# Patient Record
Sex: Female | Born: 1955 | Race: Black or African American | Hispanic: No | Marital: Married | Smoking: Never smoker
Health system: Southern US, Community
[De-identification: ages and names within clinical notes are randomized; demographics above are authoritative.]

## PROBLEM LIST (undated history)

## (undated) DIAGNOSIS — T8859XA Other complications of anesthesia, initial encounter: Secondary | ICD-10-CM

## (undated) DIAGNOSIS — L930 Discoid lupus erythematosus: Secondary | ICD-10-CM

## (undated) DIAGNOSIS — E785 Hyperlipidemia, unspecified: Secondary | ICD-10-CM

## (undated) DIAGNOSIS — R7303 Prediabetes: Secondary | ICD-10-CM

## (undated) DIAGNOSIS — I10 Essential (primary) hypertension: Secondary | ICD-10-CM

## (undated) DIAGNOSIS — E7800 Pure hypercholesterolemia, unspecified: Secondary | ICD-10-CM

## (undated) DIAGNOSIS — F32A Depression, unspecified: Secondary | ICD-10-CM

## (undated) DIAGNOSIS — Z972 Presence of dental prosthetic device (complete) (partial): Secondary | ICD-10-CM

## (undated) DIAGNOSIS — G5601 Carpal tunnel syndrome, right upper limb: Secondary | ICD-10-CM

## (undated) DIAGNOSIS — F419 Anxiety disorder, unspecified: Secondary | ICD-10-CM

## (undated) DIAGNOSIS — J45909 Unspecified asthma, uncomplicated: Secondary | ICD-10-CM

## (undated) DIAGNOSIS — Z973 Presence of spectacles and contact lenses: Secondary | ICD-10-CM

## (undated) DIAGNOSIS — M179 Osteoarthritis of knee, unspecified: Secondary | ICD-10-CM

## (undated) DIAGNOSIS — D509 Iron deficiency anemia, unspecified: Secondary | ICD-10-CM

## (undated) DIAGNOSIS — M1990 Unspecified osteoarthritis, unspecified site: Secondary | ICD-10-CM

## (undated) DIAGNOSIS — C801 Malignant (primary) neoplasm, unspecified: Secondary | ICD-10-CM

## (undated) HISTORY — PX: FOOT SURGERY: SHX648

## (undated) HISTORY — PX: ABDOMINAL HYSTERECTOMY: SHX81

---

## 1984-07-30 HISTORY — PX: FOOT SURGERY: SHX648

## 2000-07-30 DIAGNOSIS — Z9221 Personal history of antineoplastic chemotherapy: Secondary | ICD-10-CM

## 2000-07-30 DIAGNOSIS — Z8543 Personal history of malignant neoplasm of ovary: Secondary | ICD-10-CM

## 2000-07-30 HISTORY — DX: Personal history of malignant neoplasm of ovary: Z85.43

## 2000-07-30 HISTORY — DX: Personal history of antineoplastic chemotherapy: Z92.21

## 2000-07-30 HISTORY — PX: TOTAL ABDOMINAL HYSTERECTOMY W/ BILATERAL SALPINGOOPHORECTOMY: SHX83

## 2004-03-31 ENCOUNTER — Ambulatory Visit: Payer: Self-pay | Admitting: Internal Medicine

## 2004-10-28 ENCOUNTER — Emergency Department (HOSPITAL_COMMUNITY): Admission: EM | Admit: 2004-10-28 | Discharge: 2004-10-28 | Payer: Self-pay | Admitting: Emergency Medicine

## 2005-08-19 ENCOUNTER — Emergency Department (HOSPITAL_COMMUNITY): Admission: EM | Admit: 2005-08-19 | Discharge: 2005-08-19 | Payer: Self-pay | Admitting: Emergency Medicine

## 2006-05-09 ENCOUNTER — Encounter: Admission: RE | Admit: 2006-05-09 | Discharge: 2006-07-10 | Payer: Self-pay | Admitting: Orthopaedic Surgery

## 2006-06-05 ENCOUNTER — Ambulatory Visit (HOSPITAL_COMMUNITY): Admission: RE | Admit: 2006-06-05 | Discharge: 2006-06-05 | Payer: Self-pay | Admitting: Orthopaedic Surgery

## 2006-08-18 ENCOUNTER — Emergency Department (HOSPITAL_COMMUNITY): Admission: EM | Admit: 2006-08-18 | Discharge: 2006-08-18 | Payer: Self-pay | Admitting: Family Medicine

## 2007-09-02 ENCOUNTER — Emergency Department (HOSPITAL_COMMUNITY): Admission: EM | Admit: 2007-09-02 | Discharge: 2007-09-02 | Payer: Self-pay | Admitting: Emergency Medicine

## 2007-10-07 ENCOUNTER — Encounter: Admission: RE | Admit: 2007-10-07 | Discharge: 2007-12-05 | Payer: Self-pay | Admitting: Orthopaedic Surgery

## 2007-12-26 ENCOUNTER — Ambulatory Visit (HOSPITAL_COMMUNITY): Admission: RE | Admit: 2007-12-26 | Discharge: 2007-12-26 | Payer: Self-pay | Admitting: Gastroenterology

## 2007-12-26 ENCOUNTER — Encounter (INDEPENDENT_AMBULATORY_CARE_PROVIDER_SITE_OTHER): Payer: Self-pay | Admitting: Gastroenterology

## 2008-01-06 ENCOUNTER — Ambulatory Visit (HOSPITAL_COMMUNITY): Admission: RE | Admit: 2008-01-06 | Discharge: 2008-01-06 | Payer: Self-pay | Admitting: Orthopaedic Surgery

## 2008-07-21 ENCOUNTER — Ambulatory Visit (HOSPITAL_COMMUNITY): Admission: RE | Admit: 2008-07-21 | Discharge: 2008-07-21 | Payer: Self-pay | Admitting: Interventional Radiology

## 2008-07-30 DIAGNOSIS — Z86718 Personal history of other venous thrombosis and embolism: Secondary | ICD-10-CM

## 2008-07-30 HISTORY — DX: Personal history of other venous thrombosis and embolism: Z86.718

## 2008-08-14 ENCOUNTER — Emergency Department (HOSPITAL_COMMUNITY): Admission: EM | Admit: 2008-08-14 | Discharge: 2008-08-14 | Payer: Self-pay | Admitting: Family Medicine

## 2008-08-16 ENCOUNTER — Ambulatory Visit (HOSPITAL_COMMUNITY): Admission: RE | Admit: 2008-08-16 | Discharge: 2008-08-16 | Payer: Self-pay | Admitting: Gastroenterology

## 2009-02-24 ENCOUNTER — Ambulatory Visit (HOSPITAL_COMMUNITY): Admission: RE | Admit: 2009-02-24 | Discharge: 2009-02-24 | Payer: Self-pay | Admitting: Internal Medicine

## 2009-06-13 ENCOUNTER — Encounter: Admission: RE | Admit: 2009-06-13 | Discharge: 2009-06-13 | Payer: Self-pay | Admitting: Internal Medicine

## 2010-08-21 ENCOUNTER — Encounter: Payer: Self-pay | Admitting: Internal Medicine

## 2010-12-12 NOTE — Op Note (Signed)
[name], Kristen Villarreal             ACCOUNT NO.:  [id number]   MEDICAL RECORD NO.:  [id number]          PATIENT TYPE:  AMB   LOCATION:  ENDO                         FACILITY:  [facility]   PHYSICIAN:  Anselmo Rod, M.D.  DATE OF BIRTH:  [birth year]   DATE OF PROCEDURE:  12/26/2007  DATE OF DISCHARGE:  12/26/2007                               OPERATIVE REPORT   PROCEDURE PERFORMED:  Screening colonoscopy.   ENDOSCOPIST:  Anselmo Rod, M.D.   INSTRUMENT USED:  Pentax video colonoscope.   INDICATIONS FOR PROCEDURE:  55 year old African-American female  undergoing a screening colonoscopy. The patient has a history of iron  deficiency anemia, rule out chronic polyps, masses, etc.   PREPROCEDURE PREPARATION:  Informed consent was secured from the  patient.  The patient fasted for 8 hours prior to the procedure and  prepped with a bottle of magnesium citrate and a gallon of NuLYTELY the  night prior to the procedure.  The risks and benefits of the procedure  including a 10% missed rate of cancer and polyp were discussed with the  patient as well.   PREPROCEDURE PHYSICAL:  The patient has stable vital signs.  NECK:  Supple.  CHEST:  Clear to auscultation.  Spine is erect.  ABDOMEN:  Soft with normal bowel sounds.   DESCRIPTION OF THE PROCEDURE:  The patient was placed in the left  lateral decubitus position, sedated with 15 mcg of Fentanyl and 4 mg of  Versed given intravenously in slow incremental doses. Once the patient  was adequately sedated and maintained on low flow oxygen and continuous  cardiac monitoring, the Pentax video colonoscope was advanced into the  rectum to the cecum.  The appendiceal orifice and the ileocecal valve  were clearly visualized and photographed.  No masses, polyps, erosions,  ulcerations, or diverticula were noted.  Retroflexion in the rectum  revealed no abnormalities.  The patient tolerated the procedure well  without immediate complications.   IMPRESSION:  Normal colonoscopy up to the cecum.  No masses, polyps,  erosions, ulcerations, or diverticula were noted.   RECOMMENDATIONS:  1. Continue high-fiber diet with liver fluid intake.  2. Repeat colonoscopy in the next five years unless the patient has      any abnormal symptoms intermittent in which case she should contact      the office immediately for further recommendations.  3. Outpatient follow up  in the next two weeks or earlier if need be.      Anselmo Rod, M.D.  Electronically Signed     JNM/MEDQ  D:  01/02/2008  T:  01/02/2008  Job:  [id number]   cc:   Kathryne Hitch, MD  Fax: [phone]

## 2010-12-12 NOTE — Op Note (Signed)
[name], Kristen Villarreal             ACCOUNT NO.:  [id number]   MEDICAL RECORD NO.:  [id number]          PATIENT TYPE:  AMB   LOCATION:  ENDO                         FACILITY:  [facility]   PHYSICIAN:  Anselmo Rod, M.D.  DATE OF BIRTH:  08/11/1955   DATE OF PROCEDURE:  12/26/2007  DATE OF DISCHARGE:  12/26/2007                               OPERATIVE REPORT   PROCEDURE PERFORMED:  Esophagogastroduodenoscopy with small-bowel  biopsies.   ENDOSCOPIST:  Anselmo Rod, MD   INSTRUMENT USED:  Pentax video panendoscope.   INDICATIONS FOR PROCEDURE:  A 55 year old African American female  undergoing an EGD for iron-deficiency anemia, rule out sprue.   PREPROCEDURE PREPARATION:  Informed consent was procured from the  patient.  The patient had fasted for 8 hours prior to the procedure.  Risks and benefits of the procedure including a bleeding, perforation,  etc., were discussed with the patient in detail.   PREPROCEDURE PHYSICAL:  The patient had stable vital signs.  NECK:  Supple.  CHEST:  Clear to auscultation.  S1, S2 regular.  ABDOMEN:  Soft with normal bowel sounds.   DESCRIPTION OF PROCEDURE:  The patient was placed in the left lateral  decubitus position and sedated with 10 mcg of Fentanyl and 6 mg of  Versed given intravenously in slow incremental doses as she had a drop  in her blood pressure slightly prior to sedation.  Once the patient was  adequately sedate and maintained on low-flow oxygen and continuous  cardiac monitoring, the Pentax video panendoscope was advanced through  the mouthpiece, over the tongue, into the esophagus under direct vision.  The vocal cords appeared healthy.  The entire esophagus was widely  patent with no evidence of ring, stricture, mass, esophagitis or  Barrett's mucosa.  The scope was then advanced into the stomach.  The  entire gastric mucosa appeared healthy.  The proximal small bowel was  normal.  Small-bowel biopsies were done to rule out  sprue.  A small  hiatal hernia was seen on high retroflexion.  The patient tolerated the  procedure well without immediate complications.   IMPRESSION:  1. Healthy appearing vocal cords.  2. Normal-appearing, widely patent esophagus.  3. Healthy gastroesophageal junction/ Z-line.  4. Small hiatal hernia seen on high retroflexion.  5. Normal proximal small bowel.  Small-bowel biopsies done to rule out      sprue.   RECOMMENDATIONS:  1. Await pathology results.  2. Avoid all nonsteroidals for now.  3. Proceed with a colonoscopy at this time.  4. Further recommendations to be made thereafter.      Anselmo Rod, M.D.  Electronically Signed     JNM/MEDQ  D:  01/02/2008  T:  01/02/2008  Job:  [id number]   cc:   Pollyann Savoy, M.D.  Fax: [phone]

## 2012-06-18 ENCOUNTER — Other Ambulatory Visit: Payer: Self-pay | Admitting: Internal Medicine

## 2012-06-18 DIAGNOSIS — Z1231 Encounter for screening mammogram for malignant neoplasm of breast: Secondary | ICD-10-CM

## 2012-07-29 ENCOUNTER — Ambulatory Visit
Admission: RE | Admit: 2012-07-29 | Discharge: 2012-07-29 | Disposition: A | Discharge status: Home / Self Care | Payer: Medicare Other | Source: Ambulatory Visit | Attending: Internal Medicine | Admitting: Internal Medicine

## 2012-07-29 DIAGNOSIS — Z1231 Encounter for screening mammogram for malignant neoplasm of breast: Secondary | ICD-10-CM

## 2013-04-08 ENCOUNTER — Institutional Professional Consult (permissible substitution): Payer: Medicare Other | Admitting: Internal Medicine

## 2013-04-17 ENCOUNTER — Institutional Professional Consult (permissible substitution): Payer: Self-pay | Admitting: Internal Medicine

## 2013-06-30 ENCOUNTER — Other Ambulatory Visit: Payer: Self-pay

## 2013-06-30 DIAGNOSIS — Z1231 Encounter for screening mammogram for malignant neoplasm of breast: Secondary | ICD-10-CM

## 2013-08-07 ENCOUNTER — Ambulatory Visit
Admission: RE | Admit: 2013-08-07 | Discharge: 2013-08-07 | Disposition: A | Discharge status: Home / Self Care | Payer: Medicare Other | Source: Ambulatory Visit

## 2013-08-07 DIAGNOSIS — Z1231 Encounter for screening mammogram for malignant neoplasm of breast: Secondary | ICD-10-CM

## 2013-10-09 ENCOUNTER — Encounter (HOSPITAL_COMMUNITY): Payer: Self-pay | Admitting: Emergency Medicine

## 2013-10-09 ENCOUNTER — Emergency Department (HOSPITAL_COMMUNITY)
Admission: EM | Admit: 2013-10-09 | Discharge: 2013-10-09 | Disposition: A | Discharge status: Home / Self Care | Payer: Medicare Other | Attending: Emergency Medicine | Admitting: Emergency Medicine

## 2013-10-09 ENCOUNTER — Emergency Department (HOSPITAL_COMMUNITY): Payer: Medicare Other

## 2013-10-09 DIAGNOSIS — Y929 Unspecified place or not applicable: Secondary | ICD-10-CM | POA: Insufficient documentation

## 2013-10-09 DIAGNOSIS — Z23 Encounter for immunization: Secondary | ICD-10-CM | POA: Insufficient documentation

## 2013-10-09 DIAGNOSIS — T148XXA Other injury of unspecified body region, initial encounter: Secondary | ICD-10-CM

## 2013-10-09 DIAGNOSIS — W268XXA Contact with other sharp object(s), not elsewhere classified, initial encounter: Secondary | ICD-10-CM | POA: Insufficient documentation

## 2013-10-09 DIAGNOSIS — Y9389 Activity, other specified: Secondary | ICD-10-CM | POA: Insufficient documentation

## 2013-10-09 DIAGNOSIS — S91309A Unspecified open wound, unspecified foot, initial encounter: Secondary | ICD-10-CM | POA: Insufficient documentation

## 2013-10-09 DIAGNOSIS — J45909 Unspecified asthma, uncomplicated: Secondary | ICD-10-CM | POA: Insufficient documentation

## 2013-10-09 DIAGNOSIS — I10 Essential (primary) hypertension: Secondary | ICD-10-CM | POA: Insufficient documentation

## 2013-10-09 HISTORY — DX: Essential (primary) hypertension: I10

## 2013-10-09 HISTORY — DX: Unspecified asthma, uncomplicated: J45.909

## 2013-10-09 MED ORDER — CIPROFLOXACIN HCL 500 MG PO TABS: 500.0000 mg | ORAL_TABLET | Freq: Two times a day (BID) | ORAL | Status: DC

## 2013-10-09 MED ORDER — HYDROCODONE-ACETAMINOPHEN 5-325 MG PO TABS: 1.0000 | ORAL_TABLET | Freq: Four times a day (QID) | ORAL | Status: DC | PRN

## 2013-10-09 MED ORDER — TETANUS-DIPHTH-ACELL PERTUSSIS 5-2.5-18.5 LF-MCG/0.5 IM SUSP
0.5000 mL | Freq: Once | INTRAMUSCULAR | Status: AC
  Administered 2013-10-09: 0.5 mL via INTRAMUSCULAR
  Filled 2013-10-09: qty 0.5

## 2013-10-09 NOTE — ED Notes (Signed)
Patient transported to X-ray 

## 2013-10-09 NOTE — ED Provider Notes (Signed)
Medical screening examination/treatment/procedure(s) were performed by non-physician practitioner and as supervising physician I was immediately available for consultation/collaboration.   EKG Interpretation None        Osvaldo Shipper, MD 10/09/13 [phone]

## 2013-10-09 NOTE — ED Notes (Signed)
Puncture wound to the rt foot.  She stepped on a nail rusty just pta .Marland Kitchen  The nail went through the shoe on that foot

## 2013-10-09 NOTE — Discharge Instructions (Signed)
Puncture Wound °A puncture wound is an injury that extends through all layers of the skin and into the tissue beneath the skin (subcutaneous tissue). Puncture wounds become infected easily because germs often enter the body and go beneath the skin during the injury. Having a deep wound with a small entrance point makes it difficult for your caregiver to adequately clean the wound. This is especially true if you have stepped on a nail and it has passed through a dirty shoe or other situations where the wound is obviously contaminated. °CAUSES  °Many puncture wounds involve glass, nails, splinters, fish hooks, or other objects that enter the skin (foreign bodies). A puncture wound may also be caused by a human bite or animal bite. °DIAGNOSIS  °A puncture wound is usually diagnosed by your history and a physical exam. You may need to have an X-ray or an ultrasound to check for any foreign bodies still in the wound. °TREATMENT  °· Your caregiver will clean the wound as thoroughly as possible. Depending on the location of the wound, a bandage (dressing) may be applied. °· Your caregiver might prescribe antibiotic medicines. °· You may need a follow-up visit to check on your wound. Follow all instructions as directed by your caregiver. °HOME CARE INSTRUCTIONS  °· Change your dressing once per day, or as directed by your caregiver. If the dressing sticks, it may be removed by soaking the area in water. °· If your caregiver has given you follow-up instructions, it is very important that you return for a follow-up appointment. Not following up as directed could result in a chronic or permanent injury, pain, and disability. °· Only take over-the-counter or prescription medicines for pain, discomfort, or fever as directed by your caregiver. °· If you are given antibiotics, take them as directed. Finish them even if you start to feel better. °You may need a tetanus shot if: °· You cannot remember when you had your last tetanus  shot. °· You have never had a tetanus shot. °If you got a tetanus shot, your arm may swell, get red, and feel warm to the touch. This is common and not a problem. If you need a tetanus shot and you choose not to have one, there is a rare chance of getting tetanus. Sickness from tetanus can be serious. °You may need a rabies shot if an animal bite caused your puncture wound. °SEEK MEDICAL CARE IF:  °· You have redness, swelling, or increasing pain in the wound. °· You have red streaks going away from the wound. °· You notice a bad smell coming from the wound or dressing. °· You have yellowish-white fluid (pus) coming from the wound. °· You are treated with an antibiotic for infection, but the infection is not getting better. °· You notice something in the wound, such as rubber from your shoe, cloth, or another object. °· You have a fever. °· You have severe pain. °· You have difficulty breathing. °· You feel dizzy or faint. °· You cannot stop vomiting. °· You lose feeling, develop numbness, or cannot move a limb below the wound. °· Your symptoms worsen. °MAKE SURE YOU: °· Understand these instructions. °· Will watch your condition. °· Will get help right away if you are not doing well or get worse. °Document Released: 04/25/2005 Document Revised: 10/08/2011 Document Reviewed: 01/02/2011 °[employer]® Patient Information ©2014 [employer]. ° °

## 2013-10-09 NOTE — ED Provider Notes (Signed)
CSN: [id number]     Arrival date & time 10/09/13  1726 History  This chart was scribed for non-physician practitioner Montine Circle, PA-C working with Osvaldo Shipper, MD by Eston Mould, ED Scribe. This patient was seen in room TR06C/TR06C and the patient's care was started at 5:41 PM .   Chief Complaint  Patient presents with  . Puncture Wound   The history is provided by the patient. No language interpreter was used.   HPI Comments: Kristen Villarreal is a 58 y.o. female who presents to the Emergency Department complaining of puncture wound to R foot that occurred this afternoon. Pt states she stepped on a rusted nail and reports having the nail went through her shoe into her foot. She complains of mild/moderate pain. She has clean the foot with peroxide. Last tetanus shot is unknown. She is nondiabetic. She denies any fevers chills, or other associated symptoms.   Past Medical History  Diagnosis Date  . Asthma   . Hypertension    History reviewed. No pertinent past surgical history. No family history on file. History  Substance Use Topics  . Smoking status: Never Smoker   . Smokeless tobacco: Not on file  . Alcohol Use: Yes   OB History   Grav Para Term Preterm Abortions TAB SAB Ect Mult Living                 Review of Systems  Constitutional: Negative for fever and chills.  Respiratory: Negative for shortness of breath.   Cardiovascular: Negative for chest pain.  Gastrointestinal: Negative for nausea, vomiting, diarrhea and constipation.  Genitourinary: Negative for dysuria.  Skin: Positive for color change and wound.    Allergies  Review of patient's allergies indicates no known allergies.  Home Medications  No current outpatient prescriptions on file.  BP 93/63  Pulse 68  Temp(Src) 97.9 F (36.6 C) (Oral)  Resp 18  SpO2 95%  Physical Exam  Nursing note and vitals reviewed. Constitutional: She is oriented to person, place, and time. She  appears well-developed and well-nourished. No distress.  HENT:  Head: Normocephalic and atraumatic.  Eyes: EOM are normal.  Neck: Neck supple. No tracheal deviation present.  Cardiovascular: Normal rate.   Pulmonary/Chest: Effort normal. No respiratory distress.  Musculoskeletal: Normal range of motion.  Neurological: She is alert and oriented to person, place, and time.  Skin: Skin is warm and dry.  Puncture wound to the bottom of the right foot, no obvious retained foreign bodies, bleeding is controlled, moderately tender to palpation  Psychiatric: She has a normal mood and affect. Her behavior is normal.    ED Course  Procedures  DIAGNOSTIC STUDIES: Oxygen Saturation is 95% on RA, normal by my interpretation.    No results found for this or any previous visit. Dg Foot Complete Right  10/09/2013   CLINICAL DATA:  Puncture wound to the foot.  EXAM: RIGHT FOOT COMPLETE - 3+ VIEW  COMPARISON:  Radiographs dated 06/13/2009  FINDINGS: There is no radiodense foreign body in the soft tissues. No acute osseous abnormality. There are slight degenerative changes at the first metatarsophalangeal joint. Tiny plantar calcaneal spur.  IMPRESSION: No acute abnormality. No radiodense foreign body in the soft tissues.   Electronically Signed   By: Rozetta Nunnery M.D.   On: 10/09/2013 18:56      EKG Interpretation None     MDM   Final diagnoses:  Puncture wound    Patient with puncture wound to the right  foot after stepping on a nail. Plain films are negative. The wound is then irrigated and soaked. Tetanus is updated. Will discharge to home with Cipro. Return precautions given. Patient understands and agrees with the plan.  I personally performed the services described in this documentation, which was scribed in my presence. The recorded information has been reviewed and is accurate.     Montine Circle, PA-C 10/09/13 1925

## 2013-12-20 ENCOUNTER — Encounter (HOSPITAL_COMMUNITY): Payer: Self-pay | Admitting: Emergency Medicine

## 2013-12-20 ENCOUNTER — Emergency Department (HOSPITAL_COMMUNITY)
Admission: EM | Admit: 2013-12-20 | Discharge: 2013-12-20 | Disposition: A | Discharge status: Home / Self Care | Payer: Medicare Other | Attending: Emergency Medicine | Admitting: Emergency Medicine

## 2013-12-20 ENCOUNTER — Emergency Department (HOSPITAL_COMMUNITY): Payer: Medicare Other

## 2013-12-20 DIAGNOSIS — Z79899 Other long term (current) drug therapy: Secondary | ICD-10-CM | POA: Insufficient documentation

## 2013-12-20 DIAGNOSIS — I10 Essential (primary) hypertension: Secondary | ICD-10-CM | POA: Insufficient documentation

## 2013-12-20 DIAGNOSIS — J45901 Unspecified asthma with (acute) exacerbation: Secondary | ICD-10-CM

## 2013-12-20 LAB — COMPREHENSIVE METABOLIC PANEL
ALBUMIN: 3.5 g/dL (ref 3.5–5.2)
ALK PHOS: 64 U/L (ref 39–117)
ALT: 17 U/L (ref 0–35)
AST: 21 U/L (ref 0–37)
BUN: 31 mg/dL — AB (ref 6–23)
CALCIUM: 9.5 mg/dL (ref 8.4–10.5)
CHLORIDE: 104 meq/L (ref 96–112)
CO2: 22 meq/L (ref 19–32)
Creatinine, Ser: 1.53 mg/dL — ABNORMAL HIGH (ref 0.50–1.10)
GFR calc Af Amer: 42 mL/min — ABNORMAL LOW (ref >90)
GFR calc non Af Amer: 36 mL/min — ABNORMAL LOW (ref >90)
GLUCOSE: 88 mg/dL (ref 70–99)
Potassium: 4.2 mEq/L (ref 3.7–5.3)
SODIUM: 140 meq/L (ref 137–147)
TOTAL PROTEIN: 7.9 g/dL (ref 6.0–8.3)
Total Bilirubin: <0.2 mg/dL — ABNORMAL LOW (ref 0.3–1.2)

## 2013-12-20 LAB — CBC WITH DIFFERENTIAL/PLATELET
BASOS ABS: 0.1 10*3/uL (ref 0.0–0.1)
Basophils Relative: 1 % (ref 0–1)
EOS PCT: 12 % — AB (ref 0–5)
Eosinophils Absolute: 0.7 10*3/uL (ref 0.0–0.7)
HEMATOCRIT: 30.7 % — AB (ref 36.0–46.0)
Hemoglobin: 10.3 g/dL — ABNORMAL LOW (ref 12.0–15.0)
Lymphocytes Relative: 35 % (ref 12–46)
Lymphs Abs: 2 10*3/uL (ref 0.7–4.0)
MCH: 22.5 pg — AB (ref 26.0–34.0)
MCHC: 33.6 g/dL (ref 30.0–36.0)
MCV: 67.2 fL — ABNORMAL LOW (ref 78.0–100.0)
MONO ABS: 0.5 10*3/uL (ref 0.1–1.0)
MONOS PCT: 8 % (ref 3–12)
NEUTROS PCT: 44 % (ref 43–77)
Neutro Abs: 2.5 10*3/uL (ref 1.7–7.7)
PLATELETS: 277 10*3/uL (ref 150–400)
RBC: 4.57 MIL/uL (ref 3.87–5.11)
RDW: 15.9 % — ABNORMAL HIGH (ref 11.5–15.5)
WBC: 5.8 10*3/uL (ref 4.0–10.5)

## 2013-12-20 LAB — I-STAT TROPONIN, ED: TROPONIN I, POC: 0 ng/mL (ref 0.00–0.08)

## 2013-12-20 MED ORDER — METHYLPREDNISOLONE SODIUM SUCC 125 MG IJ SOLR
125.0000 mg | Freq: Once | INTRAMUSCULAR | Status: AC
  Administered 2013-12-20: 125 mg via INTRAVENOUS
  Filled 2013-12-20: qty 2

## 2013-12-20 MED ORDER — PREDNISONE 20 MG PO TABS: ORAL_TABLET | ORAL | Status: DC

## 2013-12-20 MED ORDER — ALBUTEROL (5 MG/ML) CONTINUOUS INHALATION SOLN
15.0000 mg/h | INHALATION_SOLUTION | Freq: Once | RESPIRATORY_TRACT | Status: AC
  Administered 2013-12-20: 15 mg/h via RESPIRATORY_TRACT
  Filled 2013-12-20: qty 20

## 2013-12-20 MED ORDER — IPRATROPIUM BROMIDE 0.02 % IN SOLN
1.0000 mg | Freq: Once | RESPIRATORY_TRACT | Status: AC
  Administered 2013-12-20: 1 mg via RESPIRATORY_TRACT
  Filled 2013-12-20: qty 5

## 2013-12-20 MED ORDER — SODIUM CHLORIDE 0.9 % IV BOLUS (SEPSIS)
1000.0000 mL | Freq: Once | INTRAVENOUS | Status: AC
  Administered 2013-12-20: 1000 mL via INTRAVENOUS

## 2013-12-20 NOTE — Discharge Instructions (Signed)
Take prednisone as prescribed.   Use albuterol every 4hrs for 2 days then as needed.   Follow up with your doctor.   Return to ER if you have trouble breathing, shortness of breath, worse wheezing.

## 2013-12-20 NOTE — ED Provider Notes (Signed)
CSN: [id number]     Arrival date & time 12/20/13  0741 History   First MD Initiated Contact with Patient 12/20/13 0745     Chief Complaint  Patient presents with  . Asthma     (Consider location/radiation/quality/duration/timing/severity/associated sxs/prior Treatment) The history is provided by the patient.  Kristen Villarreal is a 58 y.o. female hx of asthma, HTN, here with wheezing and shortness of breath and cough. Patient has been coughing for the last week. Has been using her rescue inhaler every 4 hours. Also has been taking cough medicine with no relief. Denies any fevers. 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 Denies leg swelling or recent travel. Not recently on steroids. Woke up at 4 am this morning with shortness of breath, not improving with albuterol.    Past Medical History  Diagnosis Date  . Asthma   . Hypertension    History reviewed. No pertinent past surgical history. History reviewed. No pertinent family history. History  Substance Use Topics  . Smoking status: Never Smoker   . Smokeless tobacco: Not on file  . Alcohol Use: Yes   OB History   Grav Para Term Preterm Abortions TAB SAB Ect Mult Living                 Review of Systems  Respiratory: Positive for cough, shortness of breath and wheezing.   All other systems reviewed and are negative.     Allergies  Strawberry; Peach flavor; and Tomato  Home Medications   Prior to Admission medications   Medication Sig Start Date End Date Taking? Authorizing Provider  albuterol (PROVENTIL HFA;VENTOLIN HFA) 108 (90 BASE) MCG/ACT inhaler Inhale 1-2 puffs into the lungs every 6 (six) hours as needed for wheezing or shortness of breath.    Historical Provider, MD  albuterol (PROVENTIL) (2.5 MG/3ML) 0.083% nebulizer solution Take 2.5 mg by nebulization every 6 (six) hours as needed for wheezing or shortness of breath.    Historical Provider, MD  ALPRAZolam Duanne Moron) 0.5 MG tablet Take 0.5 mg  by mouth 2 (two) times daily as needed for anxiety.    Historical Provider, MD  cetirizine (ZYRTEC) 10 MG tablet Take 10 mg by mouth daily as needed for allergies.    Historical Provider, MD  ciprofloxacin (CIPRO) 500 MG tablet Take 1 tablet (500 mg total) by mouth every 12 (twelve) hours. 10/09/13   Montine Circle, PA-C  citalopram (CELEXA) 20 MG tablet Take 20 mg by mouth at bedtime.     Historical Provider, MD  HYDROcodone-acetaminophen (NORCO/VICODIN) 5-325 MG per tablet Take 1-2 tablets by mouth every 6 (six) hours as needed. 10/09/13   Montine Circle, PA-C  lisinopril-hydrochlorothiazide (PRINZIDE,ZESTORETIC) 10-12.5 MG per tablet Take 1 tablet by mouth daily.    Historical Provider, MD   BP 109/61  Pulse 82  Temp(Src) 98.2 F (36.8 C) (Oral)  Resp 24  SpO2 100% Physical Exam  Nursing note and vitals reviewed. Constitutional: She is oriented to person, place, and time.  Uncomfortable, tachypneic   HENT:  Head: Normocephalic.  Mouth/Throat: Oropharynx is clear and moist.  Eyes: Conjunctivae and EOM are normal. Pupils are equal, round, and reactive to light.  Neck: Normal range of motion. Neck supple.  Cardiovascular: Normal rate, regular rhythm and normal heart sounds.   Pulmonary/Chest:  Tachypneic, + diffuse wheezing   Abdominal: Soft. Bowel sounds are normal. She exhibits no distension. There is no tenderness. There is no rebound and no guarding.  Musculoskeletal:  Normal range of motion. She exhibits no edema and no tenderness.  No calf tenderness   Neurological: She is alert and oriented to person, place, and time.  Skin: Skin is warm and dry.  Psychiatric: She has a normal mood and affect. Her behavior is normal. Judgment and thought content normal.    ED Course  Procedures (including critical care time) Labs Review Labs Reviewed  CBC WITH DIFFERENTIAL - Abnormal; Notable for the following:    Hemoglobin 10.3 (*)    HCT 30.7 (*)    MCV 67.2 (*)    MCH 22.5 (*)     RDW 15.9 (*)    Eosinophils Relative 12 (*)    All other components within normal limits  COMPREHENSIVE METABOLIC PANEL - Abnormal; Notable for the following:    BUN 31 (*)    Creatinine, Ser 1.53 (*)    Total Bilirubin <0.2 (*)    GFR calc non Af Amer 36 (*)    GFR calc Af Amer 42 (*)    All other components within normal limits  Randolm Idol, ED    Imaging Review Dg Chest 2 View  12/20/2013   CLINICAL DATA:  Asthma, shortness of breath.  EXAM: CHEST  2 VIEW  COMPARISON:  July 21, 2008.  FINDINGS: The heart size and mediastinal contours are within normal limits. Both lungs are clear. No pneumothorax or pleural effusion is noted. The visualized skeletal structures are unremarkable.  IMPRESSION: No acute cardiopulmonary abnormality seen.   Electronically Signed   By: Sabino Dick M.D.   On: 12/20/2013 08:34     EKG Interpretation   Date/Time:  Sunday Dec 20 2013 08:12:23 EDT Ventricular Rate:  76 PR Interval:  146 QRS Duration: 92 QT Interval:  394 QTC Calculation: 443 R Axis:   45 Text Interpretation:  Sinus rhythm No significant change since last  tracing Confirmed by Gwendolyne Welford  MD, Daley Gosse ([id number]) on 12/20/2013 8:19:22 AM      MDM   Final diagnoses:  None   Kristen Villarreal is a 58 y.o. female here with SOB, wheezing. Likely asthma exacerbation. I doubt PE given wheezing and good history for asthma. Will get xray, labs. Will give steroids, albuterol. Will reassess.   10:47 AM No wheezing after nebs and steroids. CXR clear. Cr 1.5, no baseline. Appears slightly dehydrated so NS 1L given. Never hypoxic. Stable for d/c home with steroids, prn albuterol.   Wandra Arthurs, MD 12/20/13 1048

## 2013-12-20 NOTE — ED Notes (Signed)
Pt reports having asthma, no relief with inhalers and neb treatment pta. spo2 100% at triage.

## 2013-12-20 NOTE — ED Notes (Signed)
MD at bedside. 

## 2014-03-19 ENCOUNTER — Other Ambulatory Visit: Payer: Self-pay | Admitting: Internal Medicine

## 2014-03-19 DIAGNOSIS — E2839 Other primary ovarian failure: Secondary | ICD-10-CM

## 2014-03-30 ENCOUNTER — Other Ambulatory Visit: Payer: Self-pay

## 2014-03-30 ENCOUNTER — Encounter (INDEPENDENT_AMBULATORY_CARE_PROVIDER_SITE_OTHER): Payer: Self-pay

## 2014-03-30 ENCOUNTER — Ambulatory Visit
Admission: RE | Admit: 2014-03-30 | Discharge: 2014-03-30 | Disposition: A | Discharge status: Home / Self Care | Payer: Medicare Other | Source: Ambulatory Visit | Attending: Internal Medicine | Admitting: Internal Medicine

## 2014-03-30 DIAGNOSIS — E2839 Other primary ovarian failure: Secondary | ICD-10-CM

## 2014-03-30 DIAGNOSIS — Z1231 Encounter for screening mammogram for malignant neoplasm of breast: Secondary | ICD-10-CM

## 2014-08-09 ENCOUNTER — Ambulatory Visit
Admission: RE | Admit: 2014-08-09 | Discharge: 2014-08-09 | Disposition: A | Discharge status: Home / Self Care | Payer: 59 | Source: Ambulatory Visit

## 2014-08-09 DIAGNOSIS — Z1231 Encounter for screening mammogram for malignant neoplasm of breast: Secondary | ICD-10-CM | POA: Diagnosis not present

## 2014-08-30 ENCOUNTER — Other Ambulatory Visit: Payer: Self-pay | Admitting: Internal Medicine

## 2014-08-30 ENCOUNTER — Ambulatory Visit
Admission: RE | Admit: 2014-08-30 | Discharge: 2014-08-30 | Disposition: A | Discharge status: Home / Self Care | Payer: Medicare Other | Source: Ambulatory Visit | Attending: Internal Medicine | Admitting: Internal Medicine

## 2014-08-30 DIAGNOSIS — M25519 Pain in unspecified shoulder: Secondary | ICD-10-CM | POA: Diagnosis not present

## 2014-08-30 DIAGNOSIS — M25512 Pain in left shoulder: Secondary | ICD-10-CM

## 2014-08-30 DIAGNOSIS — M19012 Primary osteoarthritis, left shoulder: Secondary | ICD-10-CM | POA: Diagnosis not present

## 2014-08-30 DIAGNOSIS — J4522 Mild intermittent asthma with status asthmaticus: Secondary | ICD-10-CM | POA: Diagnosis not present

## 2014-08-30 DIAGNOSIS — I10 Essential (primary) hypertension: Secondary | ICD-10-CM | POA: Diagnosis not present

## 2014-08-30 DIAGNOSIS — Z124 Encounter for screening for malignant neoplasm of cervix: Secondary | ICD-10-CM | POA: Diagnosis not present

## 2014-08-30 DIAGNOSIS — N760 Acute vaginitis: Secondary | ICD-10-CM | POA: Diagnosis not present

## 2014-10-27 DIAGNOSIS — J45998 Other asthma: Secondary | ICD-10-CM | POA: Diagnosis not present

## 2014-11-29 DIAGNOSIS — I10 Essential (primary) hypertension: Secondary | ICD-10-CM | POA: Diagnosis not present

## 2014-11-29 DIAGNOSIS — E669 Obesity, unspecified: Secondary | ICD-10-CM | POA: Diagnosis not present

## 2014-11-29 DIAGNOSIS — L930 Discoid lupus erythematosus: Secondary | ICD-10-CM | POA: Diagnosis not present

## 2014-11-29 DIAGNOSIS — J4522 Mild intermittent asthma with status asthmaticus: Secondary | ICD-10-CM | POA: Diagnosis not present

## 2014-11-29 DIAGNOSIS — M25519 Pain in unspecified shoulder: Secondary | ICD-10-CM | POA: Diagnosis not present

## 2014-12-08 DIAGNOSIS — M25512 Pain in left shoulder: Secondary | ICD-10-CM | POA: Diagnosis not present

## 2014-12-09 ENCOUNTER — Ambulatory Visit: Payer: Medicare Other | Attending: Orthopaedic Surgery

## 2014-12-09 DIAGNOSIS — R6889 Other general symptoms and signs: Secondary | ICD-10-CM

## 2014-12-09 DIAGNOSIS — M62838 Other muscle spasm: Secondary | ICD-10-CM | POA: Diagnosis not present

## 2014-12-09 DIAGNOSIS — M25512 Pain in left shoulder: Secondary | ICD-10-CM | POA: Diagnosis not present

## 2014-12-09 NOTE — Therapy (Addendum)
[place], [place], [address] Phone: [phone]   Fax:  [phone]  Physical Therapy Evaluation  Patient Details  Name: Kristen Villarreal MRN: [id number] Date of Birth: 1956-05-19 Referring Provider:  Mcarthur Rossetti*  Encounter Date: 12/09/2014      PT End of Session - 12/09/14 1050    Visit Number 1   Number of Visits 16   Date for PT Re-Evaluation 01/27/15   PT Start Time 1020   PT Stop Time 1110   PT Time Calculation (min) 50 min   Activity Tolerance Patient tolerated treatment well;Patient limited by pain   Behavior During Therapy [facility] for tasks assessed/performed      Past Medical History  Diagnosis Date  . Asthma   . Hypertension     No past surgical history on file.  There were no vitals filed for this visit.  Visit Diagnosis:  Pain in joint, shoulder region, left - Plan: PT plan of care cert/re-cert  Muscle spasm of left shoulder - Plan: PT plan of care cert/re-cert  Activity intolerance - Plan: PT plan of care cert/re-cert          [facility] PT Assessment - 12/09/14 1017    Assessment   Medical Diagnosis LT shoulder pain/impingement   Onset Date --  04/2014   Next MD Visit 01/05/15   Prior Therapy No   Precautions   Precautions None   Restrictions   Weight Bearing Restrictions No   Balance Screen   Has the patient fallen in the past 6 months Yes   How many times? 1  slipped on leaves   Has the patient had a decrease in activity level because of a fear of falling?  No   Is the patient reluctant to leave their home because of a fear of falling?  No   Prior Function   Level of Independence Independent with basic ADLs   Cognition   Overall Cognitive Status Within Functional Limits for tasks assessed   Observation/Other Assessments   Focus on Therapeutic Outcomes (FOTO)  56%   Posture/Postural Control   Posture Comments She positions arm in flex at elbow and  adduction with arm against body.    ROM / Strength   AROM / PROM / Strength AROM;Strength;PROM   AROM   AROM Assessment Site Shoulder   Right/Left Shoulder Right;Left   Right Shoulder Extension 43 Degrees   Right Shoulder Flexion 143 Degrees   Right Shoulder ABduction 145 Degrees   Right Shoulder Internal Rotation 38 Degrees   Right Shoulder External Rotation 45 Degrees   Right Shoulder Horizontal ABduction 85 Degrees   Right Shoulder Horizontal  ADduction 100 Degrees   Left Shoulder Extension 5 Degrees   Left Shoulder Flexion 65 Degrees   Left Shoulder ABduction 68 Degrees   Left Shoulder Internal Rotation 30 Degrees  humerous at side   Left Shoulder External Rotation 25 Degrees  humerours at side   PROM   PROM Assessment Site Shoulder   Right/Left Shoulder Left   Left Shoulder Extension 25 Degrees   Left Shoulder Flexion 140 Degrees   Left Shoulder ABduction 140 Degrees   Left Shoulder Internal Rotation 45 Degrees   Left Shoulder External Rotation 90 Degrees   Left Shoulder Horizontal ABduction 110 Degrees  from 90 degrees abduct   Left Shoulder Horizontal ADduction 15 Degrees  from 90 degrees abduction   Strength   Overall Strength Comments WNL when tested below shoulder level.  Palpation   Palpation Tender anterior and psterio shoulder.    Ambulation/Gait   Gait Comments WNL                   [facility] Adult PT Treatment/Exercise - 12/09/14 1017    Modalities   Modalities Electrical Stimulation;Moist Heat   Moist Heat Therapy   Number Minutes Moist Heat 20 Minutes   Moist Heat Location Shoulder  LT   Electrical Stimulation   Electrical Stimulation Location shoulder LT   Electrical Stimulation Action IFC   Electrical Stimulation Parameters L8   Electrical Stimulation Goals Pain                PT Education - 12/09/14 1050    Education provided Yes   Education Details POC   Person(s) Educated Patient   Methods Explanation   Comprehension  Verbalized understanding          PT Short Term Goals - 12/09/14 1054    PT SHORT TERM GOAL #1   Title she will be independent with inital HEP   Time 4   Period Weeks   Status New   PT SHORT TERM GOAL #2   Title She will report decrease pain 30% or more with self care   Time 4   Period Weeks   Status New   PT SHORT TERM GOAL #3   Title she will improve active motion to be able to lift arm >90 degrees  for less pain with self care   Time 4   Period Weeks           PT Long Term Goals - 12/09/14 1055    PT LONG TERM GOAL #1   Title She will be independent with all HEP issued as of last visit   Time 8   Period Weeks   Status New   PT LONG TERM GOAL #2   Title She will report pain decreased 74% or more with selfcare and home tasks   Time 8   Period Weeks   Status New   PT LONG TERM GOAL #3   Title She will have active range equal to RT shoulder for independnet selfcare and home tasks.    Time 8   Period Weeks   Status New      G Code:  Carrying: initial [place] - 12/09/14 1051    Clinical Impression Statement Pain limiting movement as PROM is equal to RT shoulder and strength appears normal. She should improve with PT    Pt will benefit from skilled therapeutic intervention in order to improve on the following deficits Increased muscle spasms;Impaired UE functional use;Pain;Decreased activity tolerance   Rehab Potential Good   PT Frequency 2x / week   PT Duration 8 weeks   PT Treatment/Interventions Moist Heat;Electrical Stimulation;Ultrasound;Patient/family education;Therapeutic exercise;Passive range of motion;Dry needling;Manual techniques   PT Next Visit Plan Initiate HEP, continue modalities, STW   Consulted and Agree with Plan of Care Patient         Problem List There are no active problems to display for this patient.   Darrel Hoover PT 12/09/2014, 11:05 AM  [facility] [address] [place], [place], [address] Phone: [phone]   Fax:  [phone]

## 2014-12-15 ENCOUNTER — Ambulatory Visit: Payer: Medicare Other | Admitting: Physical Therapy

## 2014-12-15 DIAGNOSIS — R6889 Other general symptoms and signs: Secondary | ICD-10-CM

## 2014-12-15 DIAGNOSIS — M62838 Other muscle spasm: Secondary | ICD-10-CM | POA: Diagnosis not present

## 2014-12-15 DIAGNOSIS — M25512 Pain in left shoulder: Secondary | ICD-10-CM

## 2014-12-15 NOTE — Therapy (Addendum)
[place], [place], [address] Phone: [phone]   Fax:  [phone]  Physical Therapy Treatment  Patient Details  Name: TAMECKA MILHAM MRN: [id number] Date of Birth: [birth year] Referring Provider:  Nolene Ebbs, MD  Encounter Date: 12/15/2014      PT End of Session - 12/15/14 1238    Visit Number 2   Number of Visits 16   Date for PT Re-Evaluation 01/27/15   PT Start Time 8453   PT Stop Time 1115   PT Time Calculation (min) 60 min   Activity Tolerance Patient tolerated treatment well;Patient limited by pain      Past Medical History  Diagnosis Date  . Asthma   . Hypertension     No past surgical history on file.  There were no vitals filed for this visit.  Visit Diagnosis:  Pain in joint, shoulder region, left  Muscle spasm of left shoulder  Activity intolerance      Subjective Assessment - 12/15/14 1021    Subjective Can't sleep on shoulder 6/10 , This am 20/10 .  Shoulder made a weird noise, crack , pop with shooting pain into lateral neck when she turned over in bed.  Better with heating pad.     Currently in Pain? Yes   Pain Score 6    Pain Location Shoulder   Pain Orientation Left;Anterior   Pain Descriptors / Indicators Aching  pops, sharp at times.  She can predict the rain   Pain Type Chronic pain  since Oct 2015   Pain Radiating Towards neck   Pain Onset More than a month ago   Pain Relieving Factors heating pad   Multiple Pain Sites No                         [facility] Adult PT Treatment/Exercise - 12/15/14 1040    Exercises   Exercises --  20 reps AROM, towel at axilla   Neck Exercises: Supine   Capital Flexion Limitations Deep neck flexion tactile cues 5 reps 5 seconds.   Shoulder Exercises: Sidelying   Theraband Level (Shoulder External Rotation) Level 1 (Yellow)   External Rotation Limitations --  10 reps   ABduction --  3 reps, guarded,  painful   Shoulder Exercises: Standing   Theraband Level (Shoulder Internal Rotation) Level 2 (Red)   Internal Rotation Limitations Pulling reported 10 reps, 7/10 pain eased between pulls   Theraband Level (Shoulder Extension) Level 2 (Red)   Extension Limitations 10 reps with instruction cues , technique   Theraband Level (Shoulder Row) Level 2 (Red)   Row Limitations cued technique, small movements to start   Shoulder Exercises: Pulleys   Flexion --  5 minutes   Moist Heat Therapy   Number Minutes Moist Heat 15 Minutes   Moist Heat Location Shoulder   Electrical Stimulation   Electrical Stimulation Location Shoulder   Electrical Stimulation Action IFC   Electrical Stimulation Parameters L7   Electrical Stimulation Goals Pain                  PT Short Term Goals - 12/09/14 1054    PT SHORT TERM GOAL #1   Title she will be independent with inital HEP   Time 4   Period Weeks   Status New   PT SHORT TERM GOAL #2   Title She will report decrease pain 30% or more with self care   Time  4   Period Weeks   Status New   PT SHORT TERM GOAL #3   Title she will improve active motion to be able to lift arm >90 degrees  for less pain with self care   Time 4   Period Weeks           PT Long Term Goals - 12/09/14 1055    PT LONG TERM GOAL #1   Title She will be independent with all HEP issued as of last visit   Time 8   Period Weeks   Status New   PT LONG TERM GOAL #2   Title She will report pain decreased 74% or more with selfcare and home tasks   Time 8   Period Weeks   Status New   PT LONG TERM GOAL #3   Title She will have active range equal to RT shoulder for independnet selfcare and home tasks.    Time 8   Period Weeks   Status New               Plan - 12/15/14 1240    Clinical Impression Statement able to begin home exercise program.  Pain still limits activity.  No new goals met   PT Next Visit Plan review band ER, row, Extension   PT Home  Exercise Plan see above   Consulted and Agree with Plan of Care Patient        Problem List There are no active problems to display for this patient.   [facility] 12/15/2014, 12:45 PM  [place], [place], [address] Phone: [phone]   Fax:  [phone]   Melvenia Needles, PTA 12/15/2014 12:45 PM Phone: [phone] Fax: [phone]  PHYSICAL THERAPY DISCHARGE SUMMARY  Visits from Start of Care: 2  Current functional level related to goals / functional outcomes: Unknown   Remaining deficits: Unknown   Education / Equipment: NA Plan:                                                    Patient goals were not met. Patient is being discharged due to not returning since the last visit.  ?????   Lillette Boxer Chasse  PT  08/14/14     7:42 AM

## 2014-12-21 ENCOUNTER — Ambulatory Visit: Payer: Medicare Other | Admitting: Physical Therapy

## 2014-12-21 DIAGNOSIS — R6889 Other general symptoms and signs: Secondary | ICD-10-CM | POA: Diagnosis not present

## 2014-12-21 DIAGNOSIS — M25512 Pain in left shoulder: Secondary | ICD-10-CM | POA: Diagnosis not present

## 2014-12-21 DIAGNOSIS — M62838 Other muscle spasm: Secondary | ICD-10-CM

## 2014-12-21 NOTE — Therapy (Signed)
[place], [place], [address] Phone: [phone]   Fax:  [phone]  Physical Therapy Treatment  Patient Details  Name: Kristen Villarreal MRN: [id number] Date of Birth: 1955-10-25 Referring Provider:  Nolene Ebbs, MD  Encounter Date: 12/21/2014      PT End of Session - 12/21/14 1317    Visit Number 3   Number of Visits 16   Date for PT Re-Evaluation 01/27/15   PT Start Time 1017   PT Stop Time 1117   PT Time Calculation (min) 60 min   Activity Tolerance Patient tolerated treatment well;Patient limited by pain   Behavior During Therapy [facility] for tasks assessed/performed      Past Medical History  Diagnosis Date  . Asthma   . Hypertension     No past surgical history on file.  There were no vitals filed for this visit.  Visit Diagnosis:  Pain in joint, shoulder region, left  Muscle spasm of left shoulder  Activity intolerance      Subjective Assessment - 12/21/14 1019    Subjective 3/10 pain.  Improving .     Currently in Pain? Yes   Pain Score 3    Pain Location Shoulder   Pain Orientation Left;Anterior   Pain Descriptors / Indicators Dull;Aching  small jerks post exercise not painful.    Pain Frequency Intermittent   Pain Relieving Factors hot shower   Multiple Pain Sites Yes  Also has back pain, 6/10                         [facility] Adult PT Treatment/Exercise - 12/21/14 1026    Neck Exercises: Supine   Capital Flexion Limitations Deep neck flexion tactile cues 5 reps 5 seconds.  10 reps 5 seconds   UE D1 Limitations LT AA 10 reps   Shoulder Exercises: Supine   External Rotation Both;20 reps;Theraband  yellow, cues   Flexion 10 reps  yellow band, narrow hands   Shoulder Exercises: Seated   Other Seated Exercises AA D1    Shoulder Exercises: Sidelying   Theraband Level (Shoulder External Rotation) Level 1 (Yellow)   Shoulder Exercises: Standing   Theraband Level (Shoulder Internal Rotation) Level 2 (Red)   Theraband Level (Shoulder Extension) Level 2 (Red)   Extension Limitations 10 reps with instruction cues , technique   Theraband Level (Shoulder Row) Level 2 (Red)   Row Limitations larger movements    Shoulder Exercises: Pulleys   Flexion --  5 minutes   Moist Heat Therapy   Number Minutes Moist Heat 15 Minutes   Moist Heat Location Shoulder                  PT Short Term Goals - 12/21/14 1319    PT SHORT TERM GOAL #1   Title she will be independent with inital HEP   Time 4   Period Weeks   Status Achieved   PT SHORT TERM GOAL #2   Title She will report decrease pain 30% or more with self care   Time 4   Period Weeks   Status On-going   PT SHORT TERM GOAL #3   Title she will improve active motion to be able to lift arm >90 degrees  for less pain with self care   Time 4   Status On-going           PT Long Term Goals - 12/09/14 1055  PT LONG TERM GOAL #1   Title She will be independent with all HEP issued as of last visit   Time 8   Period Weeks   Status New   PT LONG TERM GOAL #2   Title She will report pain decreased 74% or more with selfcare and home tasks   Time 8   Period Weeks   Status New   PT LONG TERM GOAL #3   Title She will have active range equal to RT shoulder for independnet selfcare and home tasks.    Time 8   Period Weeks   Status New               Plan - 12/21/14 1317    Clinical Impression Statement Pain much improved today.  Functional use improvinf in clinic and at home.  Progress with home exercise goal.   PT Next Visit Plan try body blade   Consulted and Agree with Plan of Care Patient        Problem List There are no active problems to display for this patient.   [facility] 12/21/2014, 1:20 PM  [facility] [address] [place], [place], [address] Phone: [phone]   Fax:  [phone] Melvenia Needles, PTA 12/21/2014 1:20 PM Phone: [phone] Fax: [phone]

## 2014-12-23 ENCOUNTER — Ambulatory Visit: Payer: Medicare Other | Admitting: Physical Therapy

## 2014-12-23 DIAGNOSIS — M25512 Pain in left shoulder: Secondary | ICD-10-CM

## 2014-12-23 DIAGNOSIS — M62838 Other muscle spasm: Secondary | ICD-10-CM | POA: Diagnosis not present

## 2014-12-23 DIAGNOSIS — R6889 Other general symptoms and signs: Secondary | ICD-10-CM

## 2014-12-23 NOTE — Therapy (Signed)
[place], [place], [address] Phone: [phone]   Fax:  [phone]  Physical Therapy Treatment  Patient Details  Name: Kristen Villarreal MRN: [id number] Date of Birth: Nov 23, 1955 Referring Provider:  Nolene Ebbs, MD  Encounter Date: 12/23/2014      PT End of Session - 12/23/14 1018    Visit Number 4   Number of Visits 16   Date for PT Re-Evaluation 01/27/15   PT Start Time 1008   PT Stop Time 1055   PT Time Calculation (min) 47 min      Past Medical History  Diagnosis Date  . Asthma   . Hypertension     No past surgical history on file.  There were no vitals filed for this visit.  Visit Diagnosis:  Pain in joint, shoulder region, left  Muscle spasm of left shoulder  Activity intolerance      Subjective Assessment - 12/23/14 1012    Currently in Pain? No/denies   Aggravating Factors  trying to do my hair, reach behind neck   Pain Relieving Factors hot shower            [facility] PT Assessment - 12/23/14 1015    AROM   Left Shoulder Flexion 124 Degrees   Left Shoulder ABduction 90 Degrees   Left Shoulder Internal Rotation --  reach left buttock   Left Shoulder External Rotation --  can reach upper trap                     [facility] Adult PT Treatment/Exercise - 12/23/14 1018    Shoulder Exercises: Standing   Extension Both;20 reps;Theraband   Theraband Level (Shoulder Extension) Level 2 (Red)   Row Both;20 reps;Theraband   Theraband Level (Shoulder Row) Level 2 (Red)   Retraction Both;10 reps   Other Standing Exercises standing cane for shoulder ext, IR, flexion and abduction x 10 each, also abduction and flexion x 10 each.    Shoulder Exercises: Pulleys   Flexion --  5 minutes   Flexion Limitations cues for increased ROM   ABduction 2 minutes   ABduction Limitations around 110 AAROM   Moist Heat Therapy   Number Minutes Moist Heat 12 Minutes   Moist Heat  Location Shoulder                PT Education - 12/23/14 1040    Education provided Yes   Education Details Standing [employer]) Educated Patient   Methods Explanation;Handout   Comprehension Verbalized understanding          PT Short Term Goals - 12/23/14 1016    PT SHORT TERM GOAL #1   Title she will be independent with inital HEP   Time 4   Period Weeks   Status Achieved   PT SHORT TERM GOAL #2   Title She will report decrease pain 30% or more with self care   Time 4   Period Weeks   Status On-going   PT SHORT TERM GOAL #3   Title she will improve active motion to be able to lift arm >90 degrees  for less pain with self care   Time 4   Period Weeks   Status Achieved           PT Long Term Goals - 12/23/14 1017    PT LONG TERM GOAL #1   Title She will be independent with all HEP issued as of last visit  Time 8   Period Weeks   Status On-going   PT LONG TERM GOAL #2   Title She will report pain decreased 74% or more with selfcare and home tasks   Time 8   Period Weeks   Status On-going   PT LONG TERM GOAL #3   Title She will have active range equal to RT shoulder for independnet selfcare and home tasks.    Time 8   Period Weeks   Status On-going               Plan - 12/23/14 1045    Clinical Impression Statement Pt reports no pain at rest. She reports she is unable to perform self hair care. Her AROM has improved for flexion and abduction. She was instructed in standing cane exercises and was able to reach her sacrum post therapy. Initially she could only reach her left buttock. These exercises were added to her HEP. STG# 3 Met..   PT Next Visit Plan try body blade, review standing cane, recheck AROM        Problem List There are no active problems to display for this patient.   Hessie Diener [place], [place] 12/23/2014, 10:48 AM  [facility] [address] [place], [place],  [address] Phone: [phone]   Fax:  [phone]

## 2014-12-23 NOTE — Patient Instructions (Signed)
Flexion (Eccentric) - Active-Assist (Cane)   Use unaffected arm to push affected arm forward. Avoid hiking shoulder. Keep palm relaxed. Slowly lower affected arm for 3-5 seconds, increasing use of affected arm. _10__ reps per set, _2__ sets per day, __7_ days per week. Add ___ lbs when you achieve ___ repetitions.  Copyright  VHI. All rights reserved  Cane Exercise: Abduction   Hold cane with right hand over end, palm-up, with other hand palm-down. Move arm out from side and up by pushing with other arm. Hold __5__ seconds. Repeat _10-20___ times. Do ___2_ sessions per day.  http://gt2.exer.us/81   Copyright  VHI. All rights reserved.      Cane Exercise: Extension / Internal Rotation   Stand holding cane behind back with both hands palm-up. Slide cane up spine toward head. Hold __5__ seconds. Repeat _10___ times. Do __2__ sessions per day.  http://gt2.exer.us/85   Copyright  VHI. All rights reserved.  Kasandra Knudsen Exercise: Extension   Stand holding cane behind back with both hands palm-up. Lift the cane away from body. Hold _5___ seconds. Repeat _10-20___ times. Do __2__ sessions per day.  http://gt2.exer.us/83   Copyright  VHI. All rights reserved.   Behind Back   Reach hand up back. Keep shoulders down. TRY TO CLASP HANDS TOGETHER AND HOLD 30 seconds Hint: Perform in front of mirror for feedback.  _3__ reps per set, _2__ sets per day, __7_ days per week  Copyright  VHI. All rights reserved.

## 2014-12-29 ENCOUNTER — Ambulatory Visit: Payer: Medicare Other | Attending: Orthopaedic Surgery | Admitting: Physical Therapy

## 2014-12-29 DIAGNOSIS — R6889 Other general symptoms and signs: Secondary | ICD-10-CM | POA: Diagnosis not present

## 2014-12-29 DIAGNOSIS — E669 Obesity, unspecified: Secondary | ICD-10-CM | POA: Diagnosis not present

## 2014-12-29 DIAGNOSIS — Z13220 Encounter for screening for lipoid disorders: Secondary | ICD-10-CM | POA: Diagnosis not present

## 2014-12-29 DIAGNOSIS — I10 Essential (primary) hypertension: Secondary | ICD-10-CM | POA: Diagnosis not present

## 2014-12-29 DIAGNOSIS — Z131 Encounter for screening for diabetes mellitus: Secondary | ICD-10-CM | POA: Diagnosis not present

## 2014-12-29 DIAGNOSIS — J4522 Mild intermittent asthma with status asthmaticus: Secondary | ICD-10-CM | POA: Diagnosis not present

## 2014-12-29 NOTE — Therapy (Signed)
[place], [place], [address] Phone: [phone]   Fax:  [phone]  Physical Therapy Treatment  Patient Details  Name: BETHANIA SCHLOTZHAUER MRN: [id number] Date of Birth: 1956/01/07 Referring Provider:  Nolene Ebbs, MD  Encounter Date: 12/29/2014      PT End of Session - 12/29/14 1653    Visit Number 5   Number of Visits 16   Date for PT Re-Evaluation 01/27/15   PT Start Time 7903   PT Stop Time 1635   PT Time Calculation (min) 48 min   Activity Tolerance Patient tolerated treatment well   Behavior During Therapy [facility] for tasks assessed/performed      Past Medical History  Diagnosis Date  . Asthma   . Hypertension     No past surgical history on file.  There were no vitals filed for this visit.  Visit Diagnosis:  Activity intolerance      Subjective Assessment - 12/29/14 1559    Subjective No pain since Sat. as been doing her home exercises with the cane.     Currently in Pain? No/denies   Aggravating Factors  sleeping on shoulder?   Pain Relieving Factors hot shower   Multiple Pain Sites No                         [facility] Adult PT Treatment/Exercise - 12/29/14 1600    Shoulder Exercises: Seated   External Rotation Limitations 75 degrees humerus neutral.   Internal Rotation Limitations can reach T 7   Flexion Limitations 140 AROM LT   ABduction Limitations 138 degrees   Other Seated Exercises Nustep L6, 8 minutes good posture observed.   Shoulder Exercises: Prone   Retraction 10 reps  3 LB weight   Extension --  3 LBS leaning over counter, 10 reps   Horizontal ABduction 1 10 reps  AROM   Shoulder Exercises: Standing   External Rotation 5 reps   External Rotation Limitations LT=RT   Internal Rotation 5 reps   Internal Rotation Limitations LT=RT  Reaches T7   Flexion 5 reps   Flexion Limitations LT=RT 140 degrees   ABduction 5 reps   ABduction Limitations 138  degrees LT=RT   Extension 5 reps   Extension Limitations LT=RT   Other Standing Exercises Body blade, punched, Abduction, Flexion 10to 20 seconds    Other Standing Exercises standing cane for shoulder ext, IR, flexion and abduction x 10 each, also abduction and flexion x 10 each.                   PT Short Term Goals - 12/29/14 1656    PT SHORT TERM GOAL #1   Title she will be independent with inital HEP   Time 4   Period Weeks   Status Achieved   PT SHORT TERM GOAL #2   Title She will report decrease pain 30% or more with self care   Baseline NO Pain   Time 4   Period Weeks   Status Achieved   PT SHORT TERM GOAL #3   Title she will improve active motion to be able to lift arm >90 degrees  for less pain with self care   Time 4   Period Weeks   Status Achieved           PT Long Term Goals - 12/29/14 1656    PT LONG TERM GOAL #1   Title She will  be independent with all HEP issued as of last visit   Time 8   Period Weeks   Status On-going   PT LONG TERM GOAL #2   Title She will report pain decreased 74% or more with selfcare and home tasks   Baseline No pain   Time 8   Period Weeks   Status Achieved   PT LONG TERM GOAL #3   Title She will have active range equal to RT shoulder for independnet selfcare and home tasks.    Baseline Equal to RT AROM   Time 8   Period Weeks   Status Achieved               Plan - 12/29/14 1654    Clinical Impression Statement NO pain, ROM equal to RT, all pain goals met.  May need only a few more visits if she consistantly does well.   PT Next Visit Plan Finalize home program.  Patient to think about things she needs to work on,  She is not yet sleeping on her shoulder.   Consulted and Agree with Plan of Care Patient        Problem List There are no active problems to display for this patient.   HARRIS,KAREN 12/29/2014, 4:58 PM  [facility] [address] [place], [place], [address] Phone: [phone]   Fax:  [phone]  Karen Harris, PTA 12/29/2014 4:58 PM Phone: [phone] Fax: [phone]     

## 2015-01-26 DIAGNOSIS — J45998 Other asthma: Secondary | ICD-10-CM | POA: Diagnosis not present

## 2015-02-16 DIAGNOSIS — F419 Anxiety disorder, unspecified: Secondary | ICD-10-CM | POA: Diagnosis not present

## 2015-02-16 DIAGNOSIS — I10 Essential (primary) hypertension: Secondary | ICD-10-CM | POA: Diagnosis not present

## 2015-02-16 DIAGNOSIS — Z6837 Body mass index (BMI) 37.0-37.9, adult: Secondary | ICD-10-CM | POA: Diagnosis not present

## 2015-02-16 DIAGNOSIS — J4522 Mild intermittent asthma with status asthmaticus: Secondary | ICD-10-CM | POA: Diagnosis not present

## 2015-02-16 DIAGNOSIS — E669 Obesity, unspecified: Secondary | ICD-10-CM | POA: Diagnosis not present

## 2015-03-28 DIAGNOSIS — J45998 Other asthma: Secondary | ICD-10-CM | POA: Diagnosis not present

## 2015-03-30 DIAGNOSIS — F419 Anxiety disorder, unspecified: Secondary | ICD-10-CM | POA: Diagnosis not present

## 2015-03-30 DIAGNOSIS — Z6836 Body mass index (BMI) 36.0-36.9, adult: Secondary | ICD-10-CM | POA: Diagnosis not present

## 2015-03-30 DIAGNOSIS — J4522 Mild intermittent asthma with status asthmaticus: Secondary | ICD-10-CM | POA: Diagnosis not present

## 2015-03-30 DIAGNOSIS — E669 Obesity, unspecified: Secondary | ICD-10-CM | POA: Diagnosis not present

## 2015-03-30 DIAGNOSIS — I10 Essential (primary) hypertension: Secondary | ICD-10-CM | POA: Diagnosis not present

## 2015-05-13 DIAGNOSIS — J4522 Mild intermittent asthma with status asthmaticus: Secondary | ICD-10-CM | POA: Diagnosis not present

## 2015-05-13 DIAGNOSIS — K219 Gastro-esophageal reflux disease without esophagitis: Secondary | ICD-10-CM | POA: Diagnosis not present

## 2015-05-13 DIAGNOSIS — E668 Other obesity: Secondary | ICD-10-CM | POA: Diagnosis not present

## 2015-05-13 DIAGNOSIS — I10 Essential (primary) hypertension: Secondary | ICD-10-CM | POA: Diagnosis not present

## 2015-05-13 DIAGNOSIS — F419 Anxiety disorder, unspecified: Secondary | ICD-10-CM | POA: Diagnosis not present

## 2015-05-13 DIAGNOSIS — Z1389 Encounter for screening for other disorder: Secondary | ICD-10-CM | POA: Diagnosis not present

## 2015-06-09 DIAGNOSIS — Z049 Encounter for examination and observation for unspecified reason: Secondary | ICD-10-CM | POA: Diagnosis not present

## 2015-06-09 DIAGNOSIS — Z79899 Other long term (current) drug therapy: Secondary | ICD-10-CM | POA: Diagnosis not present

## 2015-06-09 DIAGNOSIS — M329 Systemic lupus erythematosus, unspecified: Secondary | ICD-10-CM | POA: Diagnosis not present

## 2015-06-09 DIAGNOSIS — Z09 Encounter for follow-up examination after completed treatment for conditions other than malignant neoplasm: Secondary | ICD-10-CM | POA: Diagnosis not present

## 2015-06-09 DIAGNOSIS — H2513 Age-related nuclear cataract, bilateral: Secondary | ICD-10-CM | POA: Diagnosis not present

## 2015-06-15 DIAGNOSIS — Z23 Encounter for immunization: Secondary | ICD-10-CM | POA: Diagnosis not present

## 2015-06-15 DIAGNOSIS — E669 Obesity, unspecified: Secondary | ICD-10-CM | POA: Diagnosis not present

## 2015-06-15 DIAGNOSIS — D638 Anemia in other chronic diseases classified elsewhere: Secondary | ICD-10-CM | POA: Diagnosis not present

## 2015-06-15 DIAGNOSIS — L930 Discoid lupus erythematosus: Secondary | ICD-10-CM | POA: Diagnosis not present

## 2015-06-15 DIAGNOSIS — J4522 Mild intermittent asthma with status asthmaticus: Secondary | ICD-10-CM | POA: Diagnosis not present

## 2015-06-15 DIAGNOSIS — Z1389 Encounter for screening for other disorder: Secondary | ICD-10-CM | POA: Diagnosis not present

## 2015-06-15 DIAGNOSIS — I10 Essential (primary) hypertension: Secondary | ICD-10-CM | POA: Diagnosis not present

## 2015-06-15 DIAGNOSIS — E784 Other hyperlipidemia: Secondary | ICD-10-CM | POA: Diagnosis not present

## 2015-07-26 DIAGNOSIS — J45998 Other asthma: Secondary | ICD-10-CM | POA: Diagnosis not present

## 2015-08-12 ENCOUNTER — Other Ambulatory Visit: Payer: Self-pay

## 2015-08-12 DIAGNOSIS — Z1231 Encounter for screening mammogram for malignant neoplasm of breast: Secondary | ICD-10-CM

## 2015-08-24 ENCOUNTER — Ambulatory Visit
Admission: RE | Admit: 2015-08-24 | Discharge: 2015-08-24 | Disposition: A | Discharge status: Home / Self Care | Payer: Medicare Other | Source: Ambulatory Visit

## 2015-08-24 DIAGNOSIS — Z1231 Encounter for screening mammogram for malignant neoplasm of breast: Secondary | ICD-10-CM

## 2016-01-28 IMAGING — CR DG FOOT COMPLETE 3+V*R*
3 series · 3 of 3 positions shown · non-contrast
Comparison: Radiographs dated 06/13/2009

CLINICAL DATA: Puncture wound to the foot.

EXAM:
RIGHT FOOT COMPLETE - 3+ VIEW

[t foot ap right]
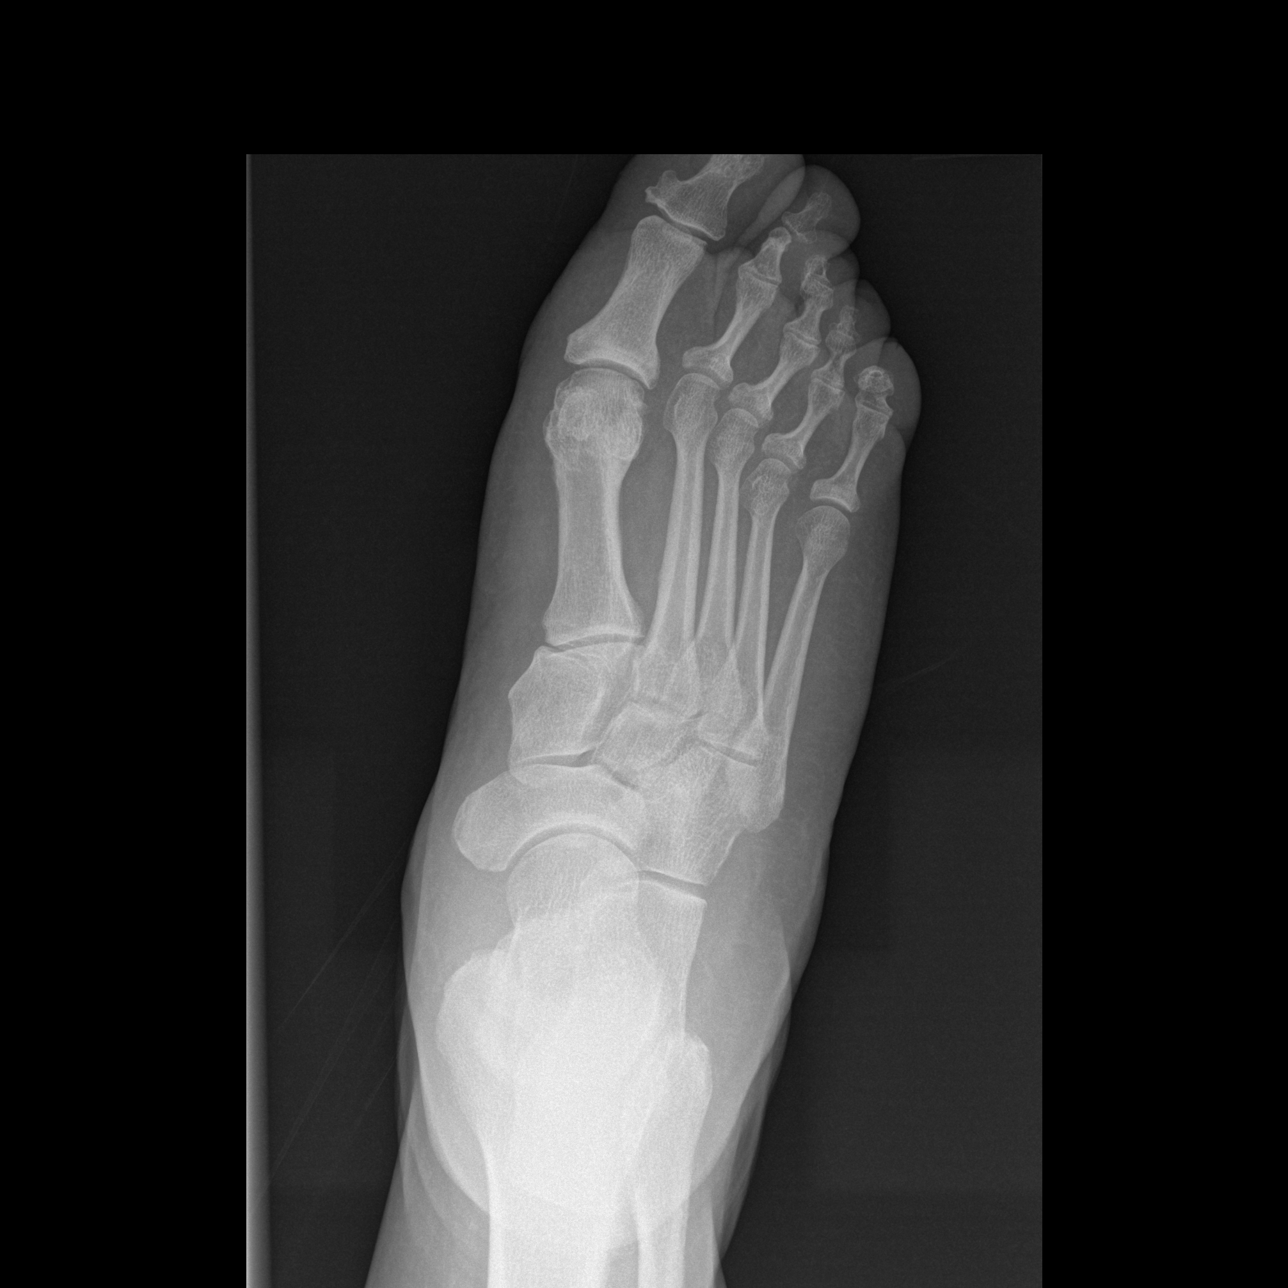

[t foot oblique right]
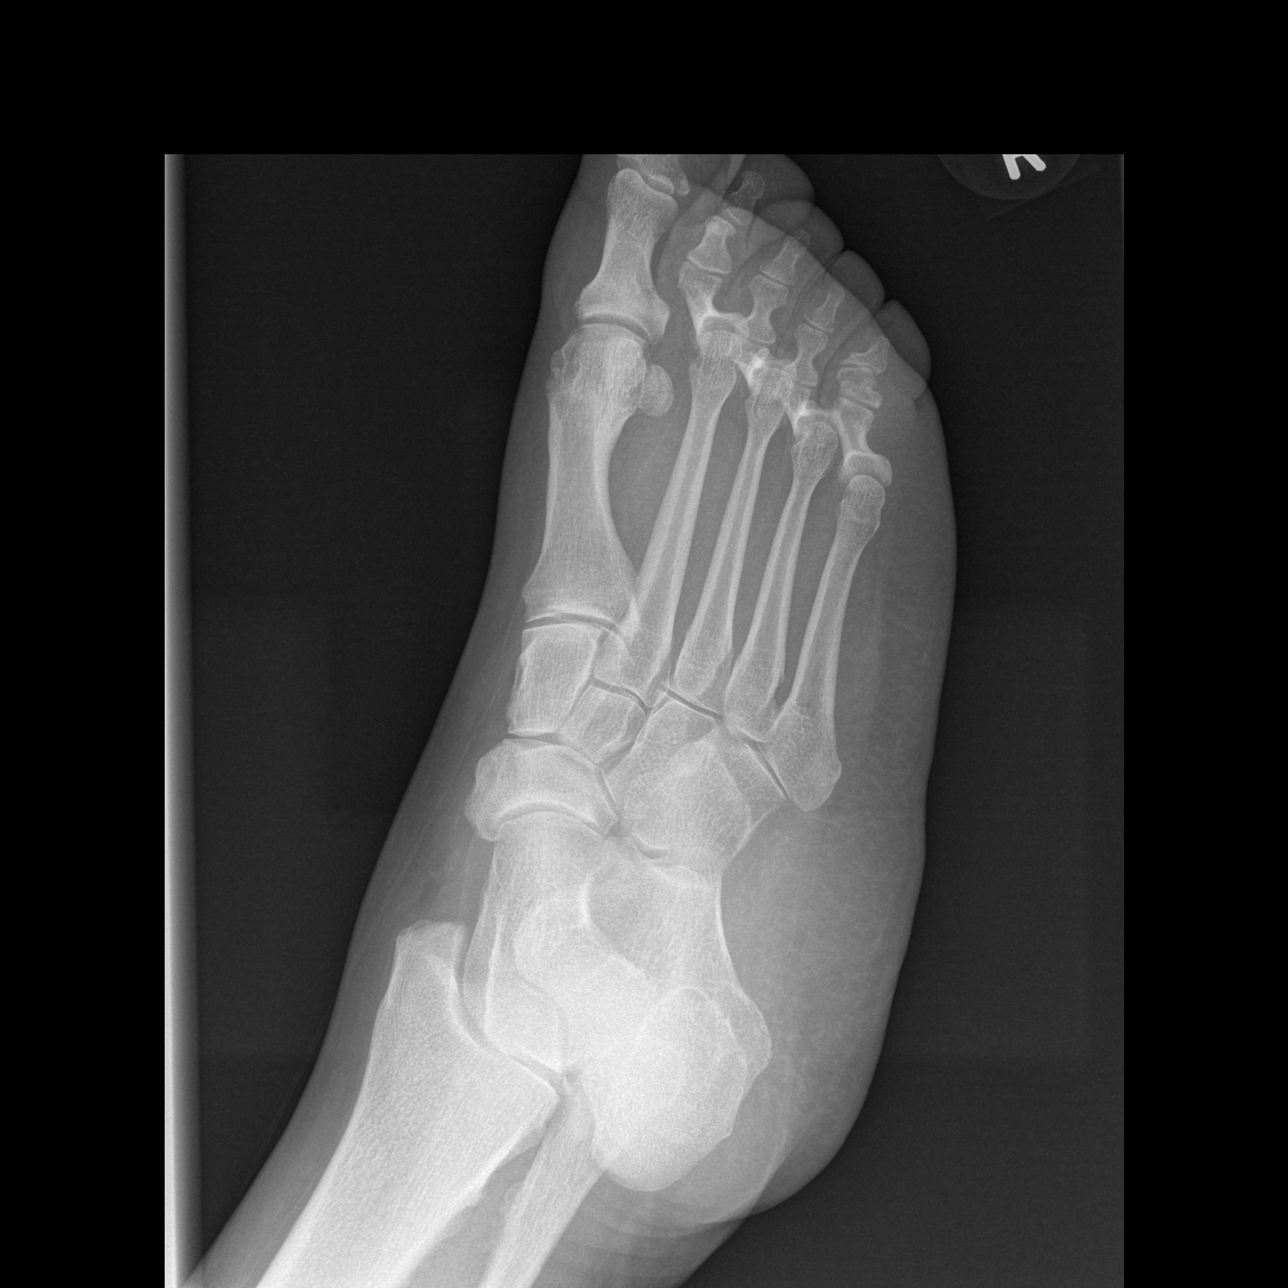

[t foot lat right]
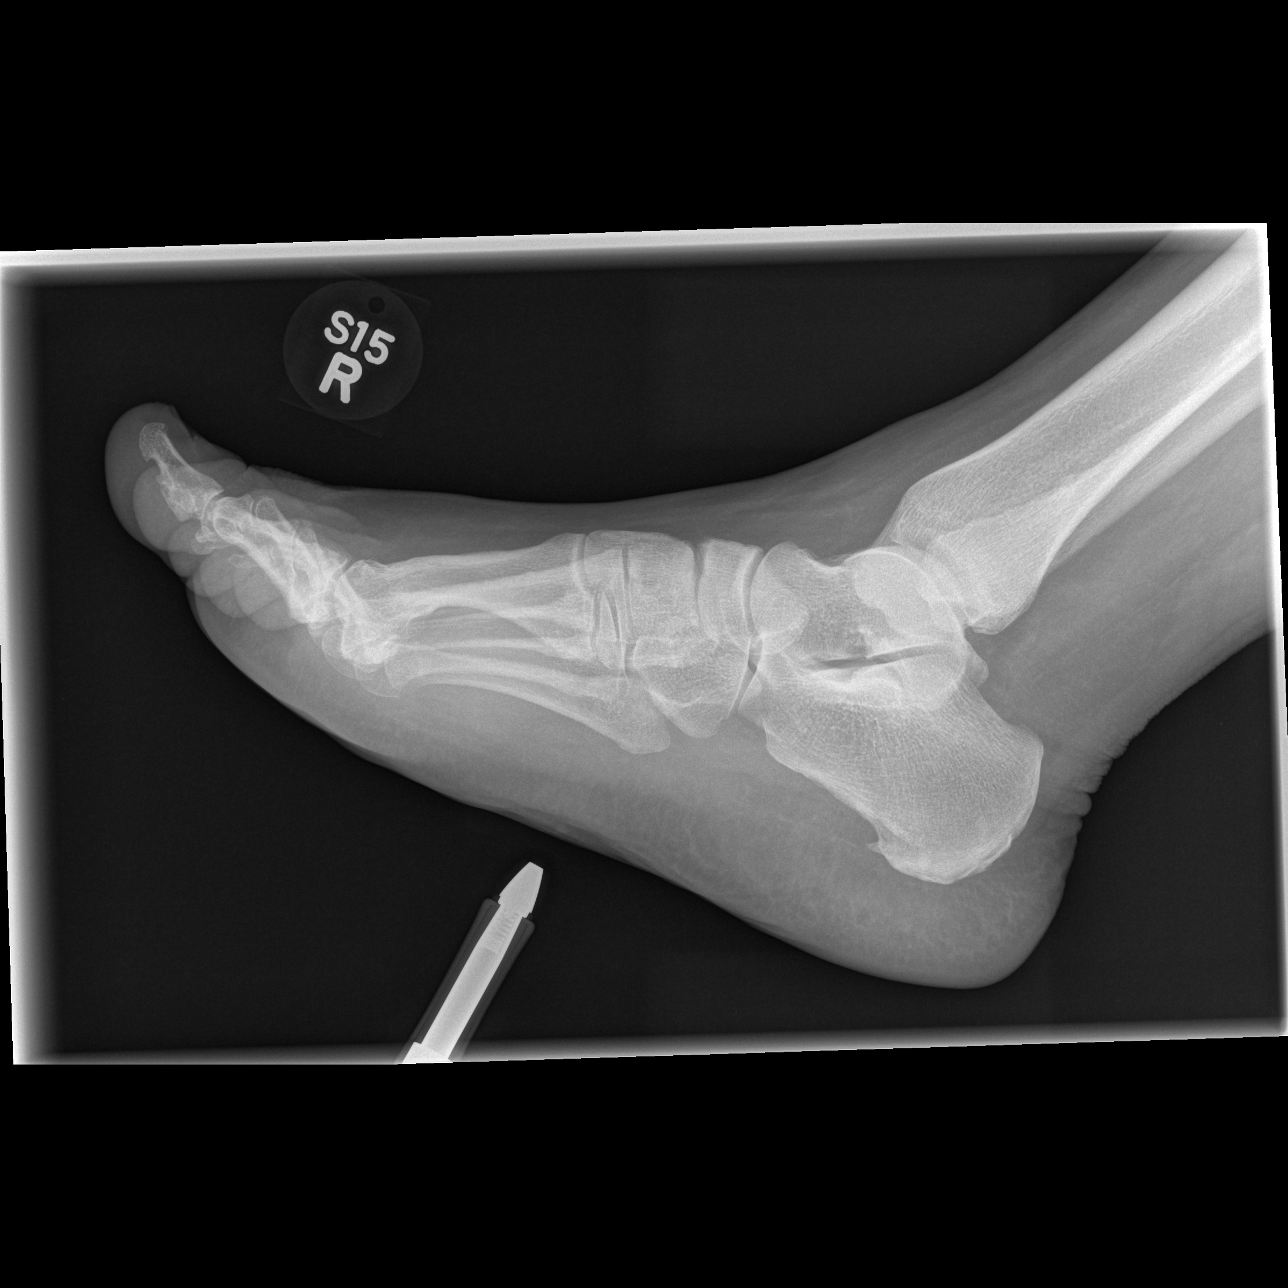

[3 of 3 positions shown; findings below may reference images not displayed]

FINDINGS: There is no radiodense foreign body in the soft tissues. No acute
osseous abnormality. There are slight degenerative changes at the
first metatarsophalangeal joint. Tiny plantar calcaneal spur.
IMPRESSION: No acute abnormality. No radiodense foreign body in the soft
tissues.

## 2016-05-15 ENCOUNTER — Other Ambulatory Visit: Payer: Self-pay | Admitting: Internal Medicine

## 2016-05-15 DIAGNOSIS — E2839 Other primary ovarian failure: Secondary | ICD-10-CM

## 2016-06-18 ENCOUNTER — Ambulatory Visit
Admission: RE | Admit: 2016-06-18 | Discharge: 2016-06-18 | Disposition: A | Discharge status: Home / Self Care | Payer: Medicare HMO | Source: Ambulatory Visit | Attending: Internal Medicine | Admitting: Internal Medicine

## 2016-06-18 ENCOUNTER — Other Ambulatory Visit: Payer: Self-pay | Admitting: Internal Medicine

## 2016-06-18 DIAGNOSIS — E2839 Other primary ovarian failure: Secondary | ICD-10-CM

## 2016-06-18 DIAGNOSIS — Z1231 Encounter for screening mammogram for malignant neoplasm of breast: Secondary | ICD-10-CM

## 2016-07-12 ENCOUNTER — Ambulatory Visit (INDEPENDENT_AMBULATORY_CARE_PROVIDER_SITE_OTHER): Payer: Medicare HMO | Admitting: Orthopaedic Surgery

## 2016-07-12 ENCOUNTER — Encounter (INDEPENDENT_AMBULATORY_CARE_PROVIDER_SITE_OTHER): Payer: Self-pay | Admitting: Orthopaedic Surgery

## 2016-07-12 DIAGNOSIS — G8929 Other chronic pain: Secondary | ICD-10-CM

## 2016-07-12 DIAGNOSIS — M25512 Pain in left shoulder: Secondary | ICD-10-CM | POA: Diagnosis not present

## 2016-07-12 MED ORDER — METHYLPREDNISOLONE ACETATE 40 MG/ML IJ SUSP
40.0000 mg | INTRAMUSCULAR | Status: AC | PRN
  Administered 2016-07-12: 40 mg via INTRA_ARTICULAR

## 2016-07-12 MED ORDER — LIDOCAINE HCL 1 % IJ SOLN
3.0000 mL | INTRAMUSCULAR | Status: AC | PRN
  Administered 2016-07-12: 3 mL

## 2016-07-12 MED ORDER — HYDROCODONE-ACETAMINOPHEN 5-325 MG PO TABS: 1.0000 | ORAL_TABLET | Freq: Two times a day (BID) | ORAL | 0 refills | Status: DC | PRN

## 2016-07-12 NOTE — Progress Notes (Signed)
   Office Visit Note   Patient: Kristen Villarreal           Date of Birth: [birth year]           MRN: [id number] Visit Date: 07/12/2016              Requested by: Nolene Ebbs, MD [address] [place], [place] [address] PCP: Philis Fendt, MD   Assessment & Plan: Visit Diagnoses:  1. Chronic left shoulder pain     Plan: She tolerated the steroid injection well. She knows to wait at least 3 months between injections. I'll try one time prescription for some hydrocodone. She is artery on meloxicam and 8 as a teen.  Follow-Up Instructions: Return if symptoms worsen or fail to improve.   Orders:  No orders of the defined types were placed in this encounter.  No orders of the defined types were placed in this encounter.     Procedures: Large Joint Inj Date/Time: 07/12/2016 2:41 PM Performed by: Mcarthur Rossetti Authorized by: Mcarthur Rossetti   Location:  Shoulder Site:  L subacromial bursa Ultrasound Guidance: No   Fluoroscopic Guidance: No   Arthrogram: No   Medications:  3 mL lidocaine 1 %; 40 mg methylPREDNISolone acetate 40 MG/ML     Clinical Data: No additional findings.   Subjective: Chief Complaint  Patient presents with  . Left Shoulder - Follow-up  . Lower Back - Follow-up    HPI He comes in today with chronic pain syndrome. It's affecting mainly her left shoulder also her low back. She is already on Zanaflex and meloxicam. She is requesting a shot in her shoulder today. She's been to physical therapy for says that really doesn't help her. Review of Systems She currently denies any chest pain, shortness of breath, fever, chills, nausea, vomiting.  Objective: Vital Signs: There were no vitals taken for this visit.  Physical Exam She is alert and oriented 3 Ortho Exam Examination of her left shoulder shows pain with any mobility of the shoulder. A lot of this is out of proportion of exam. Her shoulders well located  otherwise.  Specialty Comments:  No specialty comments available.  Imaging: No results found.   PMFS History: There are no active problems to display for this patient.  Past Medical History:  Diagnosis Date  . Asthma   . Hypertension     No family history on file.  No past surgical history on file. Social History   Occupational History  . Not on file.   Social History Main Topics  . Smoking status: Never Smoker  . Smokeless tobacco: Not on file  . Alcohol use Yes  . Drug use: Unknown  . Sexual activity: Not on file

## 2016-08-24 ENCOUNTER — Ambulatory Visit
Admission: RE | Admit: 2016-08-24 | Discharge: 2016-08-24 | Disposition: A | Discharge status: Home / Self Care | Payer: Medicare HMO | Source: Ambulatory Visit | Attending: Internal Medicine | Admitting: Internal Medicine

## 2016-08-24 DIAGNOSIS — Z1231 Encounter for screening mammogram for malignant neoplasm of breast: Secondary | ICD-10-CM

## 2017-02-09 ENCOUNTER — Ambulatory Visit (HOSPITAL_COMMUNITY)
Admission: EM | Admit: 2017-02-09 | Discharge: 2017-02-09 | Disposition: A | Discharge status: Home / Self Care | Payer: Medicare HMO | Attending: Internal Medicine | Admitting: Internal Medicine

## 2017-02-09 ENCOUNTER — Encounter (HOSPITAL_COMMUNITY): Payer: Self-pay | Admitting: *Deleted

## 2017-02-09 DIAGNOSIS — Z90710 Acquired absence of both cervix and uterus: Secondary | ICD-10-CM | POA: Diagnosis not present

## 2017-02-09 DIAGNOSIS — S161XXA Strain of muscle, fascia and tendon at neck level, initial encounter: Secondary | ICD-10-CM | POA: Insufficient documentation

## 2017-02-09 DIAGNOSIS — Z9889 Other specified postprocedural states: Secondary | ICD-10-CM | POA: Diagnosis not present

## 2017-02-09 DIAGNOSIS — Z888 Allergy status to other drugs, medicaments and biological substances status: Secondary | ICD-10-CM | POA: Insufficient documentation

## 2017-02-09 DIAGNOSIS — R0982 Postnasal drip: Secondary | ICD-10-CM

## 2017-02-09 DIAGNOSIS — X58XXXA Exposure to other specified factors, initial encounter: Secondary | ICD-10-CM | POA: Diagnosis not present

## 2017-02-09 DIAGNOSIS — E7800 Pure hypercholesterolemia, unspecified: Secondary | ICD-10-CM | POA: Diagnosis not present

## 2017-02-09 DIAGNOSIS — I10 Essential (primary) hypertension: Secondary | ICD-10-CM | POA: Diagnosis not present

## 2017-02-09 DIAGNOSIS — Z79899 Other long term (current) drug therapy: Secondary | ICD-10-CM | POA: Diagnosis not present

## 2017-02-09 DIAGNOSIS — J029 Acute pharyngitis, unspecified: Secondary | ICD-10-CM | POA: Insufficient documentation

## 2017-02-09 DIAGNOSIS — J45909 Unspecified asthma, uncomplicated: Secondary | ICD-10-CM | POA: Insufficient documentation

## 2017-02-09 DIAGNOSIS — M542 Cervicalgia: Secondary | ICD-10-CM | POA: Diagnosis not present

## 2017-02-09 DIAGNOSIS — R1013 Epigastric pain: Secondary | ICD-10-CM

## 2017-02-09 HISTORY — DX: Pure hypercholesterolemia, unspecified: E78.00

## 2017-02-09 HISTORY — DX: Malignant (primary) neoplasm, unspecified: C80.1

## 2017-02-09 LAB — POCT RAPID STREP A: Streptococcus, Group A Screen (Direct): NEGATIVE

## 2017-02-09 NOTE — ED Triage Notes (Signed)
C/O left lateral neck swelling and pain x 1 wk; started with sore throat 2 days ago.  C/O difficulty swallowing due to pain.  Unsure if fevers.

## 2017-02-09 NOTE — ED Provider Notes (Signed)
CSN: [id number]     Arrival date & time 02/09/17  1210 History   First MD Initiated Contact with Patient 02/09/17 1348     Chief Complaint  Patient presents with  . Sore Throat   (Consider location/radiation/quality/duration/timing/severity/associated sxs/prior Treatment) 61 year old female complaining of left-sided sore throat, odynophagia and PND. Symptoms started 2 days ago. She is also feeling soreness to the left side of the neck that follows along the trapezius and sternocleidomastoid muscle. The pain to the left side of the neck is exacerbated by turning head and other similar movements. Denies fevers. Not taking any medications. She has pharyngitis and is clearing her throat frequently. Current vital signs are normal and temp is 98.6.      Past Medical History:  Diagnosis Date  . Asthma   . Cancer (HCC)    stage II ovarian  . Hypercholesteremia   . Hypertension    Past Surgical History:  Procedure Laterality Date  . ABDOMINAL HYSTERECTOMY    . FOOT SURGERY     No family history on file. Social History  Substance Use Topics  . Smoking status: Never Smoker  . Smokeless tobacco: Not on file  . Alcohol use Yes     Comment: rarely   OB History    No data available     Review of Systems  Constitutional: Positive for activity change. Negative for fever.  HENT: Positive for postnasal drip, sore throat and voice change. Negative for congestion and ear pain.   Respiratory: Negative.   Cardiovascular: Negative.   Gastrointestinal: Negative.   Neurological: Negative.   All other systems reviewed and are negative.   Allergies  Strawberry extract; Peach flavor; and Tomato  Home Medications   Prior to Admission medications   Medication Sig Start Date End Date Taking? Authorizing Provider  albuterol (PROVENTIL HFA;VENTOLIN HFA) 108 (90 BASE) MCG/ACT inhaler Inhale 1-2 puffs into the lungs every 6 (six) hours as needed for wheezing or shortness of breath.   Yes  [provider]  albuterol (PROVENTIL) (2.5 MG/3ML) 0.083% nebulizer solution Take 2.5 mg by nebulization every 6 (six) hours as needed for wheezing or shortness of breath.   Yes [provider]  ALPRAZolam Duanne Moron) 0.5 MG tablet Take 0.5 mg by mouth 2 (two) times daily as needed for anxiety.   Yes [provider]  cetirizine (ZYRTEC) 10 MG tablet Take 10 mg by mouth daily as needed for allergies.   Yes [provider]  citalopram (CELEXA) 20 MG tablet Take 20 mg by mouth at bedtime.    Yes [provider]  lisinopril-hydrochlorothiazide (PRINZIDE,ZESTORETIC) 10-12.5 MG per tablet Take 1 tablet by mouth daily.   Yes [provider]  meloxicam (MOBIC) 15 MG tablet Take 15 mg by mouth daily.   Yes [provider]  phentermine (ADIPEX-P) 37.5 MG tablet TK 1 T PO QAM 06/19/16  Yes [provider]  tiZANidine (ZANAFLEX) 4 MG capsule Take 4 mg by mouth 3 (three) times daily.   Yes [provider]   Meds Ordered and Administered this Visit  Medications - No data to display  BP (!) 112/56   Pulse 87   Temp 98.6 F (37 C) (Oral)   Resp 20   SpO2 100%  No data found.   Physical Exam  Constitutional: She is oriented to person, place, and time. She appears well-developed and well-nourished. No distress.  HENT:  Head: Normocephalic and atraumatic.  Mouth/Throat: No oropharyngeal exudate.  Oropharynx with minor erythema along  the palatine arch. Very little erythema to the posterior pharynx. No swelling. No bleeding, no evidence of abscess.  Eyes: EOM are normal.  Neck: Normal range of motion. Neck supple.  Direct tenderness along the left sternocleidomastoid muscle as well as a portion of the ridge of the trapezius muscle. No palpable nodes to these areas.  Cardiovascular: Normal rate.   Pulmonary/Chest: Effort normal and breath sounds normal.  Lymphadenopathy:    She has no cervical adenopathy.  Neurological: She is  alert and oriented to person, place, and time.  Skin: Skin is warm and dry.  Nursing note and vitals reviewed.   Urgent Care Course     Procedures (including critical care time)  Labs Review Labs Reviewed  POCT RAPID STREP A    Imaging Review No results found.   Visual Acuity Review  Right Eye Distance:   Left Eye Distance:   Bilateral Distance:    Right Eye Near:   Left Eye Near:    Bilateral Near:         MDM   1. Acute pharyngitis, unspecified etiology   2. PND (post-nasal drip)   3. Sore throat   4. Cervical strain, acute, initial encounter    The drainage down the back of your throat is causing him to have a sore throat and is causing inflammation of the larynx and loss of voice. Start taking either Zyrtec or Allegra to minimize drainage. If you need something stronger he may take Chlor-Trimeton also known as chlorpheniramine 2 mg every 4 hours particularly before you go to bed. This may cause drowsiness. Ibuprofen 600 mg every 6 hours as needed for muscle pain and sore throat, Cepacol lozenges for sore throat pain, drink plenty of cool liquids.     Janne Napoleon, NP 02/09/17 [phone]

## 2017-02-09 NOTE — Discharge Instructions (Signed)
The drainage down the back of your throat is causing him to have a sore throat and is causing inflammation of the larynx and loss of voice. Start taking either Zyrtec or Allegra to minimize drainage. If you need something stronger he may take Chlor-Trimeton also known as chlorpheniramine 2 mg every 4 hours particularly before you go to bed. This may cause drowsiness. Ibuprofen 600 mg every 6 hours as needed for muscle pain and sore throat, Cepacol lozenges for sore throat pain, drink plenty of cool liquids.

## 2017-02-12 LAB — CULTURE, GROUP A STREP (THRC)

## 2017-06-23 ENCOUNTER — Encounter (HOSPITAL_COMMUNITY): Payer: Self-pay | Admitting: *Deleted

## 2017-06-23 ENCOUNTER — Other Ambulatory Visit: Payer: Self-pay

## 2017-06-23 ENCOUNTER — Emergency Department (HOSPITAL_COMMUNITY)
Admission: EM | Admit: 2017-06-23 | Discharge: 2017-06-23 | Disposition: A | Discharge status: Home / Self Care | Payer: Medicare HMO | Attending: Emergency Medicine | Admitting: Emergency Medicine

## 2017-06-23 ENCOUNTER — Emergency Department (HOSPITAL_COMMUNITY): Payer: Medicare HMO

## 2017-06-23 DIAGNOSIS — Y92410 Unspecified street and highway as the place of occurrence of the external cause: Secondary | ICD-10-CM | POA: Diagnosis not present

## 2017-06-23 DIAGNOSIS — J45909 Unspecified asthma, uncomplicated: Secondary | ICD-10-CM | POA: Diagnosis not present

## 2017-06-23 DIAGNOSIS — X509XXA Other and unspecified overexertion or strenuous movements or postures, initial encounter: Secondary | ICD-10-CM | POA: Insufficient documentation

## 2017-06-23 DIAGNOSIS — Z79899 Other long term (current) drug therapy: Secondary | ICD-10-CM | POA: Diagnosis not present

## 2017-06-23 DIAGNOSIS — Y999 Unspecified external cause status: Secondary | ICD-10-CM | POA: Diagnosis not present

## 2017-06-23 DIAGNOSIS — S93501A Unspecified sprain of right great toe, initial encounter: Secondary | ICD-10-CM | POA: Insufficient documentation

## 2017-06-23 DIAGNOSIS — Y9389 Activity, other specified: Secondary | ICD-10-CM | POA: Insufficient documentation

## 2017-06-23 DIAGNOSIS — Z8543 Personal history of malignant neoplasm of ovary: Secondary | ICD-10-CM | POA: Insufficient documentation

## 2017-06-23 DIAGNOSIS — I10 Essential (primary) hypertension: Secondary | ICD-10-CM | POA: Insufficient documentation

## 2017-06-23 DIAGNOSIS — S99921A Unspecified injury of right foot, initial encounter: Secondary | ICD-10-CM | POA: Diagnosis present

## 2017-06-23 DIAGNOSIS — S93509A Unspecified sprain of unspecified toe(s), initial encounter: Secondary | ICD-10-CM

## 2017-06-23 MED ORDER — HYDROCODONE-ACETAMINOPHEN 5-325 MG PO TABS
1.0000 | ORAL_TABLET | Freq: Once | ORAL | Status: AC
  Administered 2017-06-23: 1 via ORAL
  Filled 2017-06-23: qty 1

## 2017-06-23 MED ORDER — TRAMADOL HCL 50 MG PO TABS: 50.0000 mg | ORAL_TABLET | Freq: Four times a day (QID) | ORAL | 0 refills | Status: DC | PRN

## 2017-06-23 NOTE — ED Triage Notes (Signed)
Pt reports right big toe and foot pain for several days, only injury was "slamming on her brakes." swelling noted to right toe.

## 2017-06-23 NOTE — ED Provider Notes (Signed)
[facility] DEPART[facility] Provider Note   CSN: [id number] Arrival date & time: 06/23/17  1128     History   Chief Complaint Chief Complaint  Patient presents with  . Foot Pain    HPI Kristen Villarreal is a 61 y.o. female.  HPI Patient presents with right great toe pain and swelling.  States she noticed the symptoms after slamming on her brakes.  She is a gradual worsening of the pain.  States she is been unable to bear weight on the foot.  No numbness or tingling. Past Medical History:  Diagnosis Date  . Asthma   . Cancer (HCC)    stage II ovarian  . Hypercholesteremia   . Hypertension     There are no active problems to display for this patient.   Past Surgical History:  Procedure Laterality Date  . ABDOMINAL HYSTERECTOMY    . FOOT SURGERY      OB History    No data available       Home Medications    Prior to Admission medications   Medication Sig Start Date End Date Taking? Authorizing Provider  albuterol (PROVENTIL HFA;VENTOLIN HFA) 108 (90 BASE) MCG/ACT inhaler Inhale 1-2 puffs into the lungs every 6 (six) hours as needed for wheezing or shortness of breath.    [provider]  albuterol (PROVENTIL) (2.5 MG/3ML) 0.083% nebulizer solution Take 2.5 mg by nebulization every 6 (six) hours as needed for wheezing or shortness of breath.    [provider]  ALPRAZolam Duanne Moron) 0.5 MG tablet Take 0.5 mg by mouth 2 (two) times daily as needed for anxiety.    [provider]  cetirizine (ZYRTEC) 10 MG tablet Take 10 mg by mouth daily as needed for allergies.    [provider]  citalopram (CELEXA) 20 MG tablet Take 20 mg by mouth at bedtime.     [provider]  lisinopril-hydrochlorothiazide (PRINZIDE,ZESTORETIC) 10-12.5 MG per tablet Take 1 tablet by mouth daily.    [provider]  meloxicam (MOBIC) 15 MG tablet Take 15 mg by mouth daily.    [provider]  phentermine  (ADIPEX-P) 37.5 MG tablet TK 1 T PO QAM 06/19/16   [provider]  tiZANidine (ZANAFLEX) 4 MG capsule Take 4 mg by mouth 3 (three) times daily.    [provider]  traMADol (ULTRAM) 50 MG tablet Take 1 tablet (50 mg total) by mouth every 6 (six) hours as needed. 06/23/17   [name], MD    Family History History reviewed. No pertinent family history.  Social History Social History   Tobacco Use  . Smoking status: Never Smoker  Substance Use Topics  . Alcohol use: Yes    Comment: rarely  . Drug use: Not on file     Allergies   Strawberry extract; Peach flavor; and Tomato   Review of Systems Review of Systems  Musculoskeletal: Positive for arthralgias and joint swelling. Negative for back pain and myalgias.  Skin: Negative for color change.  Neurological: Negative for numbness.  All other systems reviewed and are negative.    Physical Exam Updated Vital Signs BP 118/73 (BP Location: Right Arm)   Pulse 87   Temp 98.7 F (37.1 C) (Oral)   Resp 16   Ht 5' (1.524 m)   Wt 89.8 kg (198 lb)   SpO2 100%   BMI 38.67 kg/m   Physical Exam  Constitutional: She is oriented to person, place, and time. She appears  well-developed and well-nourished.  HENT:  Head: Normocephalic and atraumatic.  Eyes: EOM are normal.  Neck: Normal range of motion. Neck supple.  Cardiovascular: Normal rate.  Pulmonary/Chest: Effort normal.  Abdominal: Soft.  Musculoskeletal: Normal range of motion. She exhibits edema and tenderness.  Patient with tenderness and swelling at the MTP joint of the first digit of the right foot.  There is no warmth or erythema.  No obvious bony deformity.  Good distal cap refill.  2+ dorsalis pedis and posterior tibial pulses.  Neurological: She is alert and oriented to person, place, and time.  Limited range of motion of the first digit of the right foot due to swelling.  Sensation fully intact.  Skin: Skin is warm and dry. Capillary refill  takes less than 2 seconds. No rash noted. No erythema.  Psychiatric: She has a normal mood and affect. Her behavior is normal.  Nursing note and vitals reviewed.    ED Treatments / Results  Labs (all labs ordered are listed, but only abnormal results are displayed) Labs Reviewed - No data to display  EKG  EKG Interpretation None       Radiology Dg Foot Complete Right  Result Date: 06/23/2017 CLINICAL DATA:  61 year old female with right foot pain after trauma 3 days ago. EXAM: RIGHT FOOT COMPLETE - 3+ VIEW COMPARISON:  10/09/2013 FINDINGS: There is no evidence of fracture or dislocation. There is no evidence of arthropathy or other focal bone abnormality. Soft tissues are unremarkable. IMPRESSION: Negative. Electronically Signed   By: Kristopher Oppenheim M.D.   On: 06/23/2017 12:45    Procedures Procedures (including critical care time)  Medications Ordered in ED Medications  HYDROcodone-acetaminophen (NORCO/VICODIN) 5-325 MG per tablet 1 tablet (1 tablet Oral Given 06/23/17 1226)     Initial Impression / Assessment and Plan / ED Course  I have reviewed the triage vital signs and the nursing notes.  Pertinent labs & imaging results that were available during my care of the patient were reviewed by me and considered in my medical decision making (see chart for details).     No evidence of fracture on x-ray.  Will treat for likely toe sprain.  Advised RICE treatment and follow-up with her orthopedist should symptoms persist.  Final Clinical Impressions(s) / ED Diagnoses   Final diagnoses:  Toe sprain, initial encounter    ED Discharge Orders        Ordered    traMADol (ULTRAM) 50 MG tablet  Every 6 hours PRN     06/23/17 1313       [name], MD 06/23/17 1314

## 2017-07-16 ENCOUNTER — Other Ambulatory Visit: Payer: Self-pay | Admitting: Internal Medicine

## 2017-07-16 DIAGNOSIS — Z1231 Encounter for screening mammogram for malignant neoplasm of breast: Secondary | ICD-10-CM

## 2017-08-26 ENCOUNTER — Ambulatory Visit
Admission: RE | Admit: 2017-08-26 | Discharge: 2017-08-26 | Disposition: A | Discharge status: Home / Self Care | Payer: Medicare HMO | Source: Ambulatory Visit | Attending: Internal Medicine | Admitting: Internal Medicine

## 2017-08-26 DIAGNOSIS — Z1231 Encounter for screening mammogram for malignant neoplasm of breast: Secondary | ICD-10-CM

## 2018-07-31 ENCOUNTER — Other Ambulatory Visit: Payer: Self-pay | Admitting: Internal Medicine

## 2018-07-31 DIAGNOSIS — Z1231 Encounter for screening mammogram for malignant neoplasm of breast: Secondary | ICD-10-CM

## 2018-08-28 ENCOUNTER — Ambulatory Visit: Payer: Medicare HMO

## 2018-09-19 ENCOUNTER — Ambulatory Visit: Payer: Self-pay

## 2018-10-15 ENCOUNTER — Other Ambulatory Visit: Payer: Self-pay

## 2018-10-15 ENCOUNTER — Ambulatory Visit
Admission: RE | Admit: 2018-10-15 | Discharge: 2018-10-15 | Disposition: A | Discharge status: Home / Self Care | Payer: Medicare Other | Source: Ambulatory Visit | Attending: Internal Medicine | Admitting: Internal Medicine

## 2018-10-15 DIAGNOSIS — Z1231 Encounter for screening mammogram for malignant neoplasm of breast: Secondary | ICD-10-CM

## 2018-11-03 ENCOUNTER — Ambulatory Visit (INDEPENDENT_AMBULATORY_CARE_PROVIDER_SITE_OTHER): Payer: Medicare Other | Admitting: Orthopaedic Surgery

## 2018-11-03 ENCOUNTER — Ambulatory Visit (INDEPENDENT_AMBULATORY_CARE_PROVIDER_SITE_OTHER): Payer: Medicare Other

## 2018-11-03 ENCOUNTER — Other Ambulatory Visit: Payer: Self-pay

## 2018-11-03 DIAGNOSIS — M25512 Pain in left shoulder: Secondary | ICD-10-CM

## 2018-11-03 DIAGNOSIS — G8929 Other chronic pain: Secondary | ICD-10-CM

## 2018-11-03 MED ORDER — METHYLPREDNISOLONE ACETATE 40 MG/ML IJ SUSP
40.0000 mg | INTRAMUSCULAR | Status: AC | PRN
  Administered 2018-11-03: 40 mg via INTRA_ARTICULAR

## 2018-11-03 MED ORDER — TRAMADOL HCL 50 MG PO TABS: 50.0000 mg | ORAL_TABLET | Freq: Four times a day (QID) | ORAL | 0 refills | Status: DC | PRN

## 2018-11-03 MED ORDER — LIDOCAINE HCL 1 % IJ SOLN
3.0000 mL | INTRAMUSCULAR | Status: AC | PRN
  Administered 2018-11-03: 3 mL

## 2018-11-03 MED ORDER — MELOXICAM 7.5 MG PO TABS: 7.5000 mg | ORAL_TABLET | Freq: Two times a day (BID) | ORAL | 3 refills | Status: DC | PRN

## 2018-11-03 NOTE — Progress Notes (Signed)
Office Visit Note   Patient: Kristen Villarreal           Date of Birth: 14-Jul-1956           MRN: [id number] Visit Date: 11/03/2018              Requested by: Nolene Ebbs, MD [address] [place], [place] [address] PCP: Nolene Ebbs, MD   Assessment & Plan: Visit Diagnoses:  1. Chronic left shoulder pain     Plan: Really her only option right now would be a steroid injection in the subacromial space and she agrees with this on her left shoulder.  I will try some tramadol and an anti-inflammatory as well.  She did tolerate the injection well.  All questions and concerns were answered and addressed.  We can see her back in about 3 months to see how she is doing overall.  I did show her some shoulder exercises and stretching to try.  Follow-Up Instructions: Return in about 3 months (around 02/02/2019).   Orders:  Orders Placed This Encounter  Procedures  . Large Joint Inj  . XR Shoulder Left   Meds ordered this encounter  Medications  . traMADol (ULTRAM) 50 MG tablet    Sig: Take 1-2 tablets (50-100 mg total) by mouth every 6 (six) hours as needed.    Dispense:  40 tablet    Refill:  0      Procedures: Large Joint Inj: L subacromial bursa on 11/03/2018 9:42 AM Indications: pain and diagnostic evaluation Details: 22 G 1.5 in needle  Arthrogram: No  Medications: 3 mL lidocaine 1 %; 40 mg methylPREDNISolone acetate 40 MG/ML Outcome: tolerated well, no immediate complications Procedure, treatment alternatives, risks and benefits explained, specific risks discussed. Consent was given by the patient. Immediately prior to procedure a time out was called to verify the correct patient, procedure, equipment, support staff and site/side marked as required. Patient was prepped and draped in the usual sterile fashion.       Clinical Data: No additional findings.   Subjective: Chief Complaint  Patient presents with  . Left Shoulder - Pain  The patient is someone that I  have seen before for left shoulder but it has been at least 2 years.  She has a lot of pain with overhead activities and reaching behind her with decreased range of motion and strength.  The pain is been getting worse for her.  She denies any specific injuries.  An injection in the past has helped.  Right now given the coronavirus pandemic she cannot participate in any type of therapy nor can we obtain an MRI of her shoulder.  Also surgery is not an option.  We talked about the possibility of a steroid injection today in her shoulder.  Her pain is daily and is been detrimentally affecting her activities daily living and her mobility in general for her left shoulder.  HPI  Review of Systems She currently denies any headache, chest pain, shortness of breath, fever, chills, nausea, vomiting  Objective: Vital Signs: There were no vitals taken for this visit.  Physical Exam She is alert and orient x3 and in no acute distress Ortho Exam Examination of her left shoulder shows no blocks rotation or abduction but she is very painful and trying to do these types of motions and pain is certainly a proportion of exam but certainly quite painful.  Again her shoulder is clinically well located.  There is a lot of pain of the  subacromial outlet and AC joint. Specialty Comments:  No specialty comments available.  Imaging: Xr Shoulder Left  Result Date: 11/03/2018 3 views of the left shoulder show no acute findings.  There is moderate AC joint arthritic changes.  The shoulder is well located.  The subacromial outlet is well-maintained.    PMFS History: There are no active problems to display for this patient.  Past Medical History:  Diagnosis Date  . Asthma   . Cancer (HCC)    stage II ovarian  . Hypercholesteremia   . Hypertension     No family history on file.  Past Surgical History:  Procedure Laterality Date  . ABDOMINAL HYSTERECTOMY    . FOOT SURGERY     Social History   Occupational  History  . Not on file  Tobacco Use  . Smoking status: Never Smoker  Substance and Sexual Activity  . Alcohol use: Yes    Comment: rarely  . Drug use: Not on file  . Sexual activity: Not on file

## 2019-01-05 ENCOUNTER — Other Ambulatory Visit (INDEPENDENT_AMBULATORY_CARE_PROVIDER_SITE_OTHER): Payer: Self-pay | Admitting: Orthopaedic Surgery

## 2019-02-02 ENCOUNTER — Ambulatory Visit (INDEPENDENT_AMBULATORY_CARE_PROVIDER_SITE_OTHER): Payer: Medicare Other | Admitting: Orthopaedic Surgery

## 2019-02-02 ENCOUNTER — Other Ambulatory Visit: Payer: Self-pay

## 2019-02-02 ENCOUNTER — Encounter: Payer: Self-pay | Admitting: Orthopaedic Surgery

## 2019-02-02 DIAGNOSIS — M25512 Pain in left shoulder: Secondary | ICD-10-CM | POA: Diagnosis not present

## 2019-02-02 DIAGNOSIS — G8929 Other chronic pain: Secondary | ICD-10-CM

## 2019-02-02 MED ORDER — LIDOCAINE HCL 1 % IJ SOLN
3.0000 mL | INTRAMUSCULAR | Status: AC | PRN
  Administered 2019-02-02: 3 mL

## 2019-02-02 MED ORDER — METHYLPREDNISOLONE ACETATE 40 MG/ML IJ SUSP
40.0000 mg | INTRAMUSCULAR | Status: AC | PRN
  Administered 2019-02-02: 40 mg via INTRA_ARTICULAR

## 2019-02-02 NOTE — Progress Notes (Signed)
Office Visit Note   Patient: Kristen Villarreal           Date of Birth: Jan 14, 1956           MRN: [id number] Visit Date: 02/02/2019              Requested by: Nolene Ebbs, MD [address] [place],  [place] [address] PCP: Nolene Ebbs, MD   Assessment & Plan: Visit Diagnoses:  1. Chronic left shoulder pain     Plan: Per her wishes I did provide a steroid injection in her left shoulder in the subacromial space that went in very easily.  I explained the risk and benefits of injections in detail.  All question concerns were answered addressed.  It is definitely worth her trying over-the-counter Voltaren gel.  Follow-up will be as needed.  Follow-Up Instructions: Return if symptoms worsen or fail to improve.   Orders:  Orders Placed This Encounter  Procedures  . Large Joint Inj   No orders of the defined types were placed in this encounter.     Procedures: Large Joint Inj: L subacromial bursa on 02/02/2019 9:23 AM Indications: pain and diagnostic evaluation Details: 22 G 1.5 in needle  Arthrogram: No  Medications: 3 mL lidocaine 1 %; 40 mg methylPREDNISolone acetate 40 MG/ML Outcome: tolerated well, no immediate complications Procedure, treatment alternatives, risks and benefits explained, specific risks discussed. Consent was given by the patient. Immediately prior to procedure a time out was called to verify the correct patient, procedure, equipment, support staff and site/side marked as required. Patient was prepped and draped in the usual sterile fashion.       Clinical Data: No additional findings.   Subjective: Chief Complaint  Patient presents with  . Left Shoulder - Follow-up  The patient comes in today for continued chronic left shoulder pain.  She had an injection in her shoulder 3 months ago.  She would like to try another one today.  She does have asthma and is concerned about coronavirus.  She is not a surgical candidate right now.  The injections  helped some what.  It hurts mainly with overhead activities and reaching behind her.  She does feel another one be worthwhile as .  She is had no other acute change in her medical status.  HPI  Review of Systems She currently denies any headache, chest pain, shortness of breath, fever, chills, nausea, vomiting  Objective: Vital Signs: There were no vitals taken for this visit.  Physical Exam She is alert and orient x3 and in no acute distress Ortho Exam Examination of the left shoulder shows pain throughout its arc of motion but no blocks to rotation.  She has limited strength secondary just pain but the shoulder function is okay.  There is no evidence of arthrofibrosis or frozen shoulder.  The shoulder is well located. Specialty Comments:  No specialty comments available.  Imaging: No results found.   PMFS History: There are no active problems to display for this patient.  Past Medical History:  Diagnosis Date  . Asthma   . Cancer (HCC)    stage II ovarian  . Hypercholesteremia   . Hypertension     History reviewed. No pertinent family history.  Past Surgical History:  Procedure Laterality Date  . ABDOMINAL HYSTERECTOMY    . FOOT SURGERY     Social History   Occupational History  . Not on file  Tobacco Use  . Smoking status: Never Smoker  Substance and Sexual  Activity  . Alcohol use: Yes    Comment: rarely  . Drug use: Not on file  . Sexual activity: Not on file

## 2019-09-16 ENCOUNTER — Other Ambulatory Visit: Payer: Self-pay | Admitting: Internal Medicine

## 2019-09-16 DIAGNOSIS — Z1231 Encounter for screening mammogram for malignant neoplasm of breast: Secondary | ICD-10-CM

## 2019-10-01 ENCOUNTER — Other Ambulatory Visit: Payer: Self-pay | Admitting: Internal Medicine

## 2019-10-01 DIAGNOSIS — E2839 Other primary ovarian failure: Secondary | ICD-10-CM

## 2019-10-22 ENCOUNTER — Other Ambulatory Visit: Payer: Self-pay | Admitting: Internal Medicine

## 2019-10-22 ENCOUNTER — Other Ambulatory Visit: Payer: Self-pay

## 2019-10-22 ENCOUNTER — Ambulatory Visit
Admission: RE | Admit: 2019-10-22 | Discharge: 2019-10-22 | Disposition: A | Discharge status: Home / Self Care | Payer: Medicare Other | Source: Ambulatory Visit | Attending: Internal Medicine | Admitting: Internal Medicine

## 2019-10-22 DIAGNOSIS — Z1231 Encounter for screening mammogram for malignant neoplasm of breast: Secondary | ICD-10-CM

## 2020-01-20 LAB — COLOGUARD: COLOGUARD: NEGATIVE

## 2020-11-30 ENCOUNTER — Ambulatory Visit (INDEPENDENT_AMBULATORY_CARE_PROVIDER_SITE_OTHER): Payer: Medicare HMO

## 2020-11-30 ENCOUNTER — Encounter: Payer: Self-pay | Admitting: Orthopaedic Surgery

## 2020-11-30 ENCOUNTER — Ambulatory Visit: Payer: Medicare HMO | Admitting: Orthopaedic Surgery

## 2020-11-30 ENCOUNTER — Other Ambulatory Visit: Payer: Self-pay

## 2020-11-30 DIAGNOSIS — M79644 Pain in right finger(s): Secondary | ICD-10-CM

## 2020-11-30 DIAGNOSIS — M25512 Pain in left shoulder: Secondary | ICD-10-CM

## 2020-11-30 DIAGNOSIS — G8929 Other chronic pain: Secondary | ICD-10-CM

## 2020-11-30 MED ORDER — LIDOCAINE HCL 1 % IJ SOLN
3.0000 mL | INTRAMUSCULAR | Status: AC | PRN
  Administered 2020-11-30: 3 mL

## 2020-11-30 MED ORDER — METHYLPREDNISOLONE ACETATE 40 MG/ML IJ SUSP
40.0000 mg | INTRAMUSCULAR | Status: AC | PRN
  Administered 2020-11-30: 40 mg via INTRA_ARTICULAR

## 2020-11-30 NOTE — Progress Notes (Signed)
Office Visit Note   Patient: Kristen Villarreal           Date of Birth: [birth year]           MRN: [id number] Visit Date: 11/30/2020              Requested by: Nolene Ebbs, MD [address] [place],  [place] [address] PCP: Nolene Ebbs, MD   Assessment & Plan: Visit Diagnoses:  1. Thumb pain, right   2. Chronic left shoulder pain     Plan: She understands that for her right thumb I would not do anything else now other than Voltaren gel for the pain aspect over the A1 pulley area.  Since there is no triggering I would not recommend any type of injection.  I described Voltaren gel to her and how to apply this to her right thumb.  From a shoulder standpoint, I did agree with her request for steroid injection in the left shoulder subacromial area.  She tolerated this well.  All question concerns were answered and addressed.  In the past she has had epidural steroid injections by Dr. Ernestina Patches.  If she decides to have that he can she will need that worked up again.  Follow-Up Instructions: Return if symptoms worsen or fail to improve.   Orders:  Orders Placed This Encounter  Procedures  . Large Joint Inj  . XR Finger Thumb Right   No orders of the defined types were placed in this encounter.     Procedures: Large Joint Inj: L subacromial bursa on 11/30/2020 9:37 AM Indications: pain and diagnostic evaluation Details: 22 G 1.5 in needle  Arthrogram: No  Medications: 3 mL lidocaine 1 %; 40 mg methylPREDNISolone acetate 40 MG/ML Outcome: tolerated well, no immediate complications Procedure, treatment alternatives, risks and benefits explained, specific risks discussed. Consent was given by the patient. Immediately prior to procedure a time out was called to verify the correct patient, procedure, equipment, support staff and site/side marked as required. Patient was prepped and draped in the usual sterile fashion.       Clinical Data: No additional  findings.   Subjective: Chief Complaint  Patient presents with  . Left Shoulder - Pain  . Right Thumb - Pain  The patient is a longtime patient of ours that we have not seen since 2020.  This was in July and she had a steroid injection in her left shoulder subacromial outlet.  She is requesting an injection again today.  She has had no known injury to that shoulder but it is been hurting with overhead activities.  She is 65 years old.  Injections have worked in the past.  She did want to see her for her right thumb.  She injured her thumb back in December and was in [place] taking care of her sister.  She said it was a fracture and was put in a cast and that was eventually removed.  She does report some pain on the right side but no triggering.  She denies any other acute change in her medical status  HPI  Review of Systems He currently denies any headache, chest pain, shortness of breath, fever, chills, nausea, vomiting  Objective: Vital Signs: There were no vitals taken for this visit.  Physical Exam She is alert and orient x3 and in no acute distress Ortho Exam Examination of her right thumb shows pain over the A1 pulley area but no triggering.  She lacks full extension of the IP joint  of the thumb by just a few degrees suggesting a mallet deformity.  There is no pain over the [facility] joint.  Examination of her left shoulder shows pain with any motion of the shoulder and some limited abduction secondary to pain but the shoulder is well located neck moves smoothly and is just painful.  Her Neer and Hawkins signs are positive. Specialty Comments:  No specialty comments available.  Imaging: XR Finger Thumb Right  Result Date: 11/30/2020 X-rays of the right thumb show no acute findings.    PMFS History: There are no problems to display for this patient.  Past Medical History:  Diagnosis Date  . Asthma   . Cancer (HCC)    stage II ovarian  . Hypercholesteremia   . Hypertension      History reviewed. No pertinent family history.  Past Surgical History:  Procedure Laterality Date  . ABDOMINAL HYSTERECTOMY    . FOOT SURGERY     Social History   Occupational History  . Not on file  Tobacco Use  . Smoking status: Never Smoker  . Smokeless tobacco: Not on file  Substance and Sexual Activity  . Alcohol use: Yes    Comment: rarely  . Drug use: Not on file  . Sexual activity: Not on file

## 2020-12-01 ENCOUNTER — Other Ambulatory Visit: Payer: Self-pay | Admitting: Internal Medicine

## 2020-12-01 DIAGNOSIS — Z1231 Encounter for screening mammogram for malignant neoplasm of breast: Secondary | ICD-10-CM

## 2021-01-25 ENCOUNTER — Inpatient Hospital Stay: Admission: RE | Admit: 2021-01-25 | Payer: Medicare HMO | Source: Ambulatory Visit

## 2021-03-13 ENCOUNTER — Ambulatory Visit: Payer: Medicare HMO | Admitting: Podiatry

## 2021-03-13 ENCOUNTER — Ambulatory Visit (INDEPENDENT_AMBULATORY_CARE_PROVIDER_SITE_OTHER): Payer: Medicare HMO

## 2021-03-13 ENCOUNTER — Other Ambulatory Visit: Payer: Self-pay

## 2021-03-13 DIAGNOSIS — M2042 Other hammer toe(s) (acquired), left foot: Secondary | ICD-10-CM

## 2021-03-13 DIAGNOSIS — M21612 Bunion of left foot: Secondary | ICD-10-CM | POA: Diagnosis not present

## 2021-03-13 DIAGNOSIS — M21542 Acquired clubfoot, left foot: Secondary | ICD-10-CM | POA: Diagnosis not present

## 2021-03-15 NOTE — Progress Notes (Signed)
Subjective:   Patient ID: Kristen Villarreal, female   DOB: 65 y.o.   MRN: [id number]   HPI 65 year old female presents the office today for concerns of a callus in between her first toe and second toe on her left foot which is been ongoing for many years.  Denies any swelling or redness or any drainage.  She has had no recent treatment.  Does get tender with walking with pressure.  She is prediabetic she reports which is controlled by diet.  She is unsure of her last A1c.   Review of Systems  All other systems reviewed and are negative.  Past Medical History:  Diagnosis Date   Asthma    Cancer ([place])    stage II ovarian   Hypercholesteremia    Hypertension     Past Surgical History:  Procedure Laterality Date   ABDOMINAL HYSTERECTOMY     FOOT SURGERY       Current Outpatient Medications:    Na Sulfate-K Sulfate-Mg Sulf (SUPREP BOWEL PREP KIT) 17.5-3.13-1.6 GM/177ML SOLN, See admin instructions., Disp: , Rfl:    albuterol (PROVENTIL HFA;VENTOLIN HFA) 108 (90 BASE) MCG/ACT inhaler, Inhale 1-2 puffs into the lungs every 6 (six) hours as needed for wheezing or shortness of breath., Disp: , Rfl:    albuterol (PROVENTIL) (2.5 MG/3ML) 0.083% nebulizer solution, Take 2.5 mg by nebulization every 6 (six) hours as needed for wheezing or shortness of breath., Disp: , Rfl:    ALPRAZolam (XANAX) 0.5 MG tablet, Take 0.5 mg by mouth 2 (two) times daily as needed for anxiety., Disp: , Rfl:    cetirizine (ZYRTEC) 10 MG tablet, Take 10 mg by mouth daily as needed for allergies., Disp: , Rfl:    citalopram (CELEXA) 20 MG tablet, Take 20 mg by mouth at bedtime. , Disp: , Rfl:    lisinopril-hydrochlorothiazide (PRINZIDE,ZESTORETIC) 10-12.5 MG per tablet, Take 1 tablet by mouth daily., Disp: , Rfl:    meloxicam (MOBIC) 7.5 MG tablet, TAKE 1 TABLET BY MOUTH TWICE DAILY BETWEEN MEALS AS NEEDED FOR PAIN, Disp: 60 tablet, Rfl: 3   phentermine (ADIPEX-P) 37.5 MG tablet, TK 1 T PO QAM, Disp: , Rfl: 2    tiZANidine (ZANAFLEX) 4 MG capsule, Take 4 mg by mouth 3 (three) times daily., Disp: , Rfl:    traMADol (ULTRAM) 50 MG tablet, Take 1-2 tablets (50-100 mg total) by mouth every 6 (six) hours as needed., Disp: 40 tablet, Rfl: 0  Allergies  Allergen Reactions   Strawberry Extract Anaphylaxis   Peach Flavor Swelling   Tomato Hives          Objective:  Physical Exam  General: AAO x3, NAD  Dermatological: Thick hyperkeratotic tissue is present on the first interspace of the left foot without any underlying ulceration drainage or any signs of infection.  No edema, erythema noted bilaterally today.  No open sores.  Vascular: Dorsalis Pedis artery and Posterior Tibial artery pedal pulses are palpable bilateral with immedate capillary fill time. There is no pain with calf compression, swelling, warmth, erythema.   Neruologic: Grossly intact via light touch bilateral.   Musculoskeletal: On the left foot in particular the bunion, hammertoe present.  The hallux is abutting the second toe which is likely resulted in hyperkeratotic lesion on the interspace.  Gait: Unassisted, Nonantalgic.       Assessment:   Hyperkeratotic lesion left foot due to digital deformity     Plan:  -Treatment options discussed including all alternatives, risks, and complications -Etiology of symptoms  were discussed -X-rays were obtained and reviewed with the patient.  When he is present with elevation of the first ray.  Hammertoes present -I sharply debrided the hyperkeratotic lesion to the and complications of bleeding.  Discussed offloading and dispensed offloading pads.  Discussed shoe modifications avoid any pressure.  In the future consider surgical intervention if needed but would likely need a first MPJ arthrodesis and hammertoe repair.  Trula Slade DPM

## 2021-06-19 ENCOUNTER — Encounter: Payer: Self-pay | Admitting: Physician Assistant

## 2021-06-19 ENCOUNTER — Other Ambulatory Visit: Payer: Self-pay

## 2021-06-19 ENCOUNTER — Ambulatory Visit: Payer: Medicare HMO | Admitting: Physician Assistant

## 2021-06-19 DIAGNOSIS — M79641 Pain in right hand: Secondary | ICD-10-CM | POA: Diagnosis not present

## 2021-06-19 DIAGNOSIS — M79642 Pain in left hand: Secondary | ICD-10-CM | POA: Diagnosis not present

## 2021-06-19 NOTE — Progress Notes (Signed)
HPI: Kristen Villarreal returns today with bilateral hand pain that began this past September.  No known acute injury.  She does have a history of right thumb fracture last December and remote history of multiple finger fractures left hand.  She is having numbness tingling in both hands, left slightly worse than right.  She notes that the pain involves her thumb through ring finger and stops on the lateral aspect of the ring fingers.  She at time is awakening due to the pain in her hands.  She has a hard time gripping things due to the pain in her hands.  She tried gabapentin but had to stop due to overall swelling with this.  She is also tried Mobic without any real relief.  Review of systems: Please see HPI otherwise negative or noncontributory.  Physical exam: General well-developed well-nourished female no acute distress. Psych: Alert and oriented x3 Bilateral hands no rashes skin lesions ulcerations impending ulcers.  Radial pulses are 2+ bilaterally equal symmetric.  Subjective decreased sensation throughout the median nerve distribution of both hands.  Positive Tinel's over the median nerve at the wrist bilaterally.  Positive compression test over the median nerve from both wrists.  Phalen's is positive bilaterally.   Impression: Bilateral hand numbness/pain  Plan: Recommend EMG nerve conduction studies of the upper extremities to rule out carpal tunnel syndrome as source of her pain and numbness in both hands.  Follow-up with [place] after the studies to go over results and discuss further treatment.  Questions were encouraged and answered at length

## 2021-06-19 NOTE — Addendum Note (Signed)
Addended by: Robyne Peers on: 06/19/2021 01:20 PM   Modules accepted: Orders

## 2021-06-27 ENCOUNTER — Other Ambulatory Visit: Payer: Self-pay

## 2021-06-27 ENCOUNTER — Encounter: Payer: Self-pay | Admitting: Physical Medicine and Rehabilitation

## 2021-06-27 ENCOUNTER — Ambulatory Visit (INDEPENDENT_AMBULATORY_CARE_PROVIDER_SITE_OTHER): Payer: Medicare HMO | Admitting: Physical Medicine and Rehabilitation

## 2021-06-27 DIAGNOSIS — R202 Paresthesia of skin: Secondary | ICD-10-CM

## 2021-06-27 NOTE — Progress Notes (Signed)
Pt state pain in bot hand between the thumb and pointer fingers. Pt state she feels numbness and tingling in her hands. Pt state the pain is so bad that it wakes her up at night. Pt state she right handed.  Numeric Pain Rating Scale and Functional Assessment Average Pain 10   In the last MONTH (on 0-10 scale) has pain interfered with the following?  1. General activity like being  able to carry out your everyday physical activities such as walking, climbing stairs, carrying groceries, or moving a chair?  Rating(10)

## 2021-06-27 NOTE — Progress Notes (Signed)
Kristen Villarreal - 65 y.o. female MRN [id number]  Date of birth: Mar 01, 1956  Office Visit Note: Visit Date: 06/27/2021 PCP: Nolene Ebbs, MD Referred by: Nolene Ebbs, MD  Subjective: Chief Complaint  Patient presents with   Right Hand - Numbness, Pain, Weakness   Left Hand - Numbness, Pain, Weakness   HPI:  Kristen Villarreal is a 65 y.o. female who comes in today at the request of Benita Stabile, PA-C for electrodiagnostic study of the Bilateral upper extremities.  Patient is Right hand dominant.  She reports pain numbness and tingling in both hands fairly equally somewhat left more than right.  She gets numbness and tingling in a pretty classic median nerve distribution in the radial digits.  She does have nocturnal complaints and complaints with certain positions.  She reports this has been ongoing since September without any relief despite good conservative treatment.  She denies any radicular symptoms. ROS Otherwise per HPI.  Assessment & Plan: Visit Diagnoses:    ICD-10-CM   1. 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  Left slightly worse than right.  Despite good decompression treatment patient may have residual symptoms.   There is no significant electrodiagnostic evidence of any other focal nerve entrapment, brachial plexopathy or cervical radiculopathy.   Recommendations: 1.  Follow-up with referring physician. 2.  Continue current management of symptoms. 3.  Suggest surgical evaluation.  Meds & Orders: No orders of the defined types were placed in this encounter.   Orders Placed This Encounter  Procedures   NCV with EMG (electromyography)    Follow-up: Return in about 2 weeks (around 07/11/2021) for Benita Stabile, P.A.-C.    Procedures: No procedures performed  EMG & NCV Findings: Evaluation of the left median motor and the right median motor nerves showed prolonged distal onset latency (L7.1, R9.8 ms), reduced amplitude (L3.9, R2.2 mV), and decreased conduction velocity (Elbow-Wrist, L40, R41 m/s).  The left median (across palm) sensory nerve showed no response (Wrist) and no response (Palm).  The right median (across palm) sensory nerve showed no response (Palm), prolonged distal peak latency (6.7 ms), and reduced amplitude (5.0 V).  The left radial sensory nerve showed prolonged distal peak latency (3.3 ms).  The right ulnar sensory nerve showed reduced amplitude (11.4 V).  All remaining nerves (as indicated in the following tables) were within normal limits.  Left vs. Right side comparison data for the median motor nerve indicates abnormal L-R latency difference (2.7 ms).  All remaining left vs. right side differences were within normal limits.    Needle evaluation of the left abductor pollicis brevis muscle showed increased insertional activity and diminished recruitment.  All remaining muscles (as indicated in the following table) showed no evidence of electrical instability.    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  Left slightly worse than right.  Despite good decompression treatment patient may have residual symptoms.   There is no significant electrodiagnostic evidence of any other focal nerve entrapment, brachial plexopathy or cervical radiculopathy.   Recommendations: 1.  Follow-up with referring physician. 2.  Continue current management of symptoms. 3.  Suggest surgical evaluation.  ___________________________ Laurence Spates [facility] Board  Certified, Tax adviser of Physical Medicine and Rehabilitation    Nerve Conduction  Studies Anti Sensory Summary Table   Stim Site NR Peak (ms) Norm Peak (ms) P-T Amp (V) Norm P-T Amp Site1 Site2 Delta-P (ms) Dist (cm) Vel (m/s) Norm Vel (m/s)  Left Median Acr Palm Anti Sensory (2nd Digit)  31.4C  Wrist *NR  <3.6  >10 Wrist Palm  0.0    Palm *NR  <2.0          Right Median Acr Palm Anti Sensory (2nd Digit)  30.6C  Wrist    *6.7 <3.6 *5.0 >10 Wrist Palm  0.0    Palm *NR  <2.0          Left Radial Anti Sensory (Base 1st Digit)  30.6C  Wrist    *3.3 <3.1 10.3  Wrist Base 1st Digit 3.3 0.0    Right Radial Anti Sensory (Base 1st Digit)  30.3C  Wrist    2.3 <3.1 22.4  Wrist Base 1st Digit 2.3 0.0    Left Ulnar Anti Sensory (5th Digit)  31.3C  Wrist    3.6 <3.7 30.9 >15.0 Wrist 5th Digit 3.6 14.0 39 >38  Right Ulnar Anti Sensory (5th Digit)  30.6C  Wrist    3.5 <3.7 *11.4 >15.0 Wrist 5th Digit 3.5 14.0 40 >38   Motor Summary Table   Stim Site NR Onset (ms) Norm Onset (ms) O-P Amp (mV) Norm O-P Amp Site1 Site2 Delta-0 (ms) Dist (cm) Vel (m/s) Norm Vel (m/s)  Left Median Motor (Abd Poll Brev)  30.6C  Wrist    *7.1 <4.2 *3.9 >5 Elbow Wrist 4.9 19.5 *40 >50  Elbow    12.0  1.0         Right Median Motor (Abd Poll Brev)  30.3C  Wrist    *9.8 <4.2 *2.2 >5 Elbow Wrist 4.7 19.5 *41 >50  Elbow    14.5  1.9         Left Ulnar Motor (Abd Dig Min)  30.6C  Wrist    3.4 <4.2 8.5 >3 B Elbow Wrist 2.9 19.5 67 >53  B Elbow    6.3  9.4  A Elbow B Elbow 1.4 10.0 71 >53  A Elbow    7.7  8.9         Right Ulnar Motor (Abd Dig Min)  30.2C  Wrist    3.4 <4.2 8.9 >3 B Elbow Wrist 2.9 19.0 66 >53  B Elbow    6.3  9.4  A Elbow B Elbow 1.6 10.0 62 >53  A Elbow    7.9  9.2          EMG   Side Muscle Nerve Root Ins Act Fibs Psw Amp Dur Poly Recrt Int Fraser Din Comment  Left Abd Poll Brev Median C8-T1 *Incr Nml Nml Nml Nml 0 *Reduced Nml   Left 1stDorInt Ulnar C8-T1 Nml Nml Nml Nml Nml 0 Nml Nml   Left PronatorTeres Median C6-7 Nml Nml Nml Nml Nml 0 Nml Nml   Left Biceps Musculocut C5-6  Nml Nml Nml Nml Nml 0 Nml Nml     Nerve Conduction Studies Anti Sensory Left/Right Comparison   Stim Site L Lat (ms) R Lat (ms) L-R Lat (ms) L Amp (V) R Amp (V) L-R Amp (%) Site1 Site2 L Vel (m/s) R Vel (m/s) L-R Vel (m/s)  Median Acr Palm Anti Sensory (2nd Digit)  31.4C  Wrist  *6.7   *5.0  Wrist Palm  Palm             Radial Anti Sensory (Base 1st Digit)  30.6C  Wrist *3.3 2.3 1.0 10.3 22.4 54.0 Wrist Base 1st Digit     Ulnar Anti Sensory (5th Digit)  31.3C  Wrist 3.6 3.5 0.1 30.9 *11.4 63.1 Wrist 5th Digit 39 40 1   Motor Left/Right Comparison   Stim Site L Lat (ms) R Lat (ms) L-R Lat (ms) L Amp (mV) R Amp (mV) L-R Amp (%) Site1 Site2 L Vel (m/s) R Vel (m/s) L-R Vel (m/s)  Median Motor (Abd Poll Brev)  30.6C  Wrist *7.1 *9.8 *2.7 *3.9 *2.2 43.6 Elbow Wrist *40 *41 1  Elbow 12.0 14.5 2.5 1.0 1.9 47.4       Ulnar Motor (Abd Dig Min)  30.6C  Wrist 3.4 3.4 0.0 8.5 8.9 4.5 B Elbow Wrist 67 66 1  B Elbow 6.3 6.3 0.0 9.4 9.4 0.0 A Elbow B Elbow 71 62 9  A Elbow 7.7 7.9 0.2 8.9 9.2 3.3          Waveforms:                     Clinical History: No specialty comments available.     Objective:  VS:  HT:    WT:   BMI:     BP:   HR: bpm  TEMP: ( )  RESP:  Physical Exam Musculoskeletal:        General: No swelling, tenderness or deformity.     Comments: Inspection reveals no atrophy of the bilateral APB or FDI or hand intrinsics. There is no swelling, color changes, allodynia or dystrophic changes. There is 5 out of 5 strength in the bilateral wrist extension, finger abduction and long finger flexion. There is impaired sensation to light touch in the bilateral median nerve distributions.  There is a positive Phalen's test bilaterally. There is a negative Hoffmann's test bilaterally.  Skin:    General: Skin is warm and dry.     Findings: No erythema or rash.  Neurological:     General: No focal deficit present.     Mental Status: She is alert and oriented to  person, place, and time.     Motor: No weakness or abnormal muscle tone.     Coordination: Coordination normal.  Psychiatric:        Mood and Affect: Mood normal.        Behavior: Behavior normal.     Imaging: No results found.

## 2021-06-28 NOTE — Procedures (Signed)
EMG & NCV Findings: Evaluation of the left median motor and the right median motor nerves showed prolonged distal onset latency (L7.1, R9.8 ms), reduced amplitude (L3.9, R2.2 mV), and decreased conduction velocity (Elbow-Wrist, L40, R41 m/s).  The left median (across palm) sensory nerve showed no response (Wrist) and no response (Palm).  The right median (across palm) sensory nerve showed no response (Palm), prolonged distal peak latency (6.7 ms), and reduced amplitude (5.0 V).  The left radial sensory nerve showed prolonged distal peak latency (3.3 ms).  The right ulnar sensory nerve showed reduced amplitude (11.4 V).  All remaining nerves (as indicated in the following tables) were within normal limits.  Left vs. Right side comparison data for the median motor nerve indicates abnormal L-R latency difference (2.7 ms).  All remaining left vs. right side differences were within normal limits.    Needle evaluation of the left abductor pollicis brevis muscle showed increased insertional activity and diminished recruitment.  All remaining muscles (as indicated in the following table) showed no evidence of electrical instability.    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  Left slightly worse than right.  Despite good decompression treatment patient may have residual symptoms.   There is no significant electrodiagnostic evidence of any other focal nerve entrapment, brachial plexopathy or cervical radiculopathy.   Recommendations: 1.  Follow-up with referring physician. 2.  Continue current management of symptoms. 3.  Suggest surgical evaluation.  ___________________________ Laurence Spates FAAPMR Board Certified, American Board of Physical Medicine and Rehabilitation    Nerve Conduction Studies Anti Sensory Summary Table   Stim Site NR  Peak (ms) Norm Peak (ms) P-T Amp (V) Norm P-T Amp Site1 Site2 Delta-P (ms) Dist (cm) Vel (m/s) Norm Vel (m/s)  Left Median Acr Palm Anti Sensory (2nd Digit)  31.4C  Wrist *NR  <3.6  >10 Wrist Palm  0.0    Palm *NR  <2.0          Right Median Acr Palm Anti Sensory (2nd Digit)  30.6C  Wrist    *6.7 <3.6 *5.0 >10 Wrist Palm  0.0    Palm *NR  <2.0          Left Radial Anti Sensory (Base 1st Digit)  30.6C  Wrist    *3.3 <3.1 10.3  Wrist Base 1st Digit 3.3 0.0    Right Radial Anti Sensory (Base 1st Digit)  30.3C  Wrist    2.3 <3.1 22.4  Wrist Base 1st Digit 2.3 0.0    Left Ulnar Anti Sensory (5th Digit)  31.3C  Wrist    3.6 <3.7 30.9 >15.0 Wrist 5th Digit 3.6 14.0 39 >38  Right Ulnar Anti Sensory (5th Digit)  30.6C  Wrist    3.5 <3.7 *11.4 >15.0 Wrist 5th Digit 3.5 14.0 40 >38   Motor Summary Table   Stim Site NR Onset (ms) Norm Onset (ms) O-P Amp (mV) Norm O-P Amp Site1 Site2 Delta-0 (ms) Dist (cm) Vel (m/s) Norm Vel (m/s)  Left Median Motor (Abd Poll Brev)  30.6C  Wrist    *7.1 <4.2 *3.9 >5 Elbow Wrist 4.9 19.5 *40 >50  Elbow    12.0  1.0         Right Median Motor (Abd Poll Brev)  30.3C  Wrist    *9.8 <4.2 *2.2 >5 Elbow Wrist 4.7 19.5 *41 >50  Elbow    14.5  1.9         Left Ulnar Motor (Abd Dig Min)  30.6C  Wrist    3.4 <4.2 8.5 >3 B Elbow Wrist 2.9 19.5 67 >53  B Elbow    6.3  9.4  A Elbow B Elbow 1.4 10.0 71 >53  A Elbow    7.7  8.9         Right Ulnar Motor (Abd Dig Min)  30.2C  Wrist    3.4 <4.2 8.9 >3 B Elbow Wrist 2.9 19.0 66 >53  B Elbow    6.3  9.4  A Elbow B Elbow 1.6 10.0 62 >53  A Elbow    7.9  9.2          EMG   Side Muscle Nerve Root Ins Act Fibs Psw Amp Dur Poly Recrt Int Fraser Din Comment  Left Abd Poll Brev Median C8-T1 *Incr Nml Nml Nml Nml 0 *Reduced Nml   Left 1stDorInt Ulnar C8-T1 Nml Nml Nml Nml Nml 0 Nml Nml   Left PronatorTeres Median C6-7 Nml Nml Nml Nml Nml 0 Nml Nml   Left Biceps Musculocut C5-6 Nml Nml Nml Nml Nml 0 Nml Nml     Nerve Conduction  Studies Anti Sensory Left/Right Comparison   Stim Site L Lat (ms) R Lat (ms) L-R Lat (ms) L Amp (V) R Amp (V) L-R Amp (%) Site1 Site2 L Vel (m/s) R Vel (m/s) L-R Vel (m/s)  Median Acr Palm Anti Sensory (2nd Digit)  31.4C  Wrist  *6.7   *5.0  Wrist Palm     Palm             Radial Anti Sensory (Base 1st Digit)  30.6C  Wrist *3.3 2.3 1.0 10.3 22.4 54.0 Wrist Base 1st Digit     Ulnar Anti Sensory (5th Digit)  31.3C  Wrist 3.6 3.5 0.1 30.9 *11.4 63.1 Wrist 5th Digit 39 40 1   Motor Left/Right Comparison   Stim Site L Lat (ms) R Lat (ms) L-R Lat (ms) L Amp (mV) R Amp (mV) L-R Amp (%) Site1 Site2 L Vel (m/s) R Vel (m/s) L-R Vel (m/s)  Median Motor (Abd Poll Brev)  30.6C  Wrist *7.1 *9.8 *2.7 *3.9 *2.2 43.6 Elbow Wrist *40 *41 1  Elbow 12.0 14.5 2.5 1.0 1.9 47.4       Ulnar Motor (Abd Dig Min)  30.6C  Wrist 3.4 3.4 0.0 8.5 8.9 4.5 B Elbow Wrist 67 66 1  B Elbow 6.3 6.3 0.0 9.4 9.4 0.0 A Elbow B Elbow 71 62 9  A Elbow 7.7 7.9 0.2 8.9 9.2 3.3          Waveforms:

## 2021-07-10 ENCOUNTER — Other Ambulatory Visit: Payer: Self-pay

## 2021-07-10 ENCOUNTER — Encounter: Payer: Self-pay | Admitting: Physician Assistant

## 2021-07-10 ENCOUNTER — Ambulatory Visit (INDEPENDENT_AMBULATORY_CARE_PROVIDER_SITE_OTHER): Payer: Medicare HMO | Admitting: Physician Assistant

## 2021-07-10 DIAGNOSIS — G5602 Carpal tunnel syndrome, left upper limb: Secondary | ICD-10-CM | POA: Diagnosis not present

## 2021-07-10 DIAGNOSIS — G5601 Carpal tunnel syndrome, right upper limb: Secondary | ICD-10-CM

## 2021-07-10 DIAGNOSIS — G5603 Carpal tunnel syndrome, bilateral upper limbs: Secondary | ICD-10-CM | POA: Diagnosis not present

## 2021-07-10 NOTE — Progress Notes (Signed)
HPI:Kristen Villarreal returns today to go over the EMG nerve conduction studies upper extremities.  She continues to have severe pain in both hands left greater than right.  EMG nerve conduction study showed severe carpal tunnel syndrome bilaterally.  Impression: Bilateral carpal tunnel syndrome severe  Plan: Recommend vitamin B6 100 mg twice daily.  Recommend carpal tunnel release bilaterally starting with the hand that is most bothersome.  She would like to proceed with the left hand carpal tunnel surgery in the near future.  Discussed risk benefits of surgery and postoperative protocol with her.  Questions were encouraged and answered at length.

## 2021-07-31 ENCOUNTER — Other Ambulatory Visit: Payer: Self-pay | Admitting: Physician Assistant

## 2021-08-01 ENCOUNTER — Encounter (HOSPITAL_BASED_OUTPATIENT_CLINIC_OR_DEPARTMENT_OTHER): Payer: Self-pay | Admitting: Orthopaedic Surgery

## 2021-08-01 ENCOUNTER — Other Ambulatory Visit: Payer: Self-pay

## 2021-08-03 ENCOUNTER — Encounter (HOSPITAL_BASED_OUTPATIENT_CLINIC_OR_DEPARTMENT_OTHER)
Admission: RE | Admit: 2021-08-03 | Discharge: 2021-08-03 | Disposition: A | Discharge status: Home / Self Care | Payer: No Typology Code available for payment source | Source: Ambulatory Visit | Attending: Orthopaedic Surgery | Admitting: Orthopaedic Surgery

## 2021-08-03 DIAGNOSIS — Z01818 Encounter for other preprocedural examination: Secondary | ICD-10-CM | POA: Insufficient documentation

## 2021-08-03 LAB — BASIC METABOLIC PANEL
Anion gap: 8 (ref 5–15)
BUN: 18 mg/dL (ref 8–23)
CO2: 24 mmol/L (ref 22–32)
Calcium: 9.6 mg/dL (ref 8.9–10.3)
Chloride: 109 mmol/L (ref 98–111)
Creatinine, Ser: 1.46 mg/dL — ABNORMAL HIGH (ref 0.44–1.00)
GFR, Estimated: 39 mL/min — ABNORMAL LOW (ref >60)
Glucose, Bld: 114 mg/dL — ABNORMAL HIGH (ref 70–99)
Potassium: 4.2 mmol/L (ref 3.5–5.1)
Sodium: 141 mmol/L (ref 135–145)

## 2021-08-03 NOTE — Progress Notes (Signed)

## 2021-08-09 NOTE — Anesthesia Preprocedure Evaluation (Addendum)
Anesthesia Evaluation  Patient identified by MRN, date of birth, ID band Patient awake    Reviewed: Allergy & Precautions, NPO status , Patient's Chart, lab work & pertinent test results  Airway Mallampati: II  TM Distance: >3 FB Neck ROM: Full    Dental no notable dental hx. (+) Teeth Intact, Dental Advisory Given   Pulmonary asthma ,    Pulmonary exam normal breath sounds clear to auscultation       Cardiovascular hypertension, Pt. on medications Normal cardiovascular exam Rhythm:Regular Rate:Normal     Neuro/Psych Anxiety negative neurological ROS     GI/Hepatic negative GI ROS, Neg liver ROS,   Endo/Other    Renal/GU negative Renal ROS     Musculoskeletal  (+) Arthritis ,   Abdominal   Peds  Hematology negative hematology ROS (+)   Anesthesia Other Findings   Reproductive/Obstetrics                            Anesthesia Physical Anesthesia Plan  ASA: 2  Anesthesia Plan: MAC   Post-op Pain Management: Tylenol PO (pre-op)   Induction:   PONV Risk Score and Plan: 3 and Treatment may vary due to age or medical condition, Midazolam and Ondansetron  Airway Management Planned: Natural Airway and Simple Face Mask  Additional Equipment: None  Intra-op Plan:   Post-operative Plan:   Informed Consent: I have reviewed the patients History and Physical, chart, labs and discussed the procedure including the risks, benefits and alternatives for the proposed anesthesia with the patient or authorized representative who has indicated his/her understanding and acceptance.     Dental advisory given  Plan Discussed with: CRNA and Anesthesiologist  Anesthesia Plan Comments:        Anesthesia Quick Evaluation

## 2021-08-10 ENCOUNTER — Ambulatory Visit (HOSPITAL_BASED_OUTPATIENT_CLINIC_OR_DEPARTMENT_OTHER): Payer: No Typology Code available for payment source | Admitting: Anesthesiology

## 2021-08-10 ENCOUNTER — Encounter (HOSPITAL_BASED_OUTPATIENT_CLINIC_OR_DEPARTMENT_OTHER)
Admission: RE | Disposition: A | Discharge status: Home / Self Care | Payer: Self-pay | Source: Home / Self Care | Attending: Orthopaedic Surgery

## 2021-08-10 ENCOUNTER — Other Ambulatory Visit: Payer: Self-pay

## 2021-08-10 ENCOUNTER — Encounter (HOSPITAL_BASED_OUTPATIENT_CLINIC_OR_DEPARTMENT_OTHER): Payer: Self-pay | Admitting: Orthopaedic Surgery

## 2021-08-10 ENCOUNTER — Ambulatory Visit (HOSPITAL_BASED_OUTPATIENT_CLINIC_OR_DEPARTMENT_OTHER)
Admission: RE | Admit: 2021-08-10 | Discharge: 2021-08-10 | Disposition: A | Discharge status: Home / Self Care | Payer: No Typology Code available for payment source | Attending: Orthopaedic Surgery | Admitting: Orthopaedic Surgery

## 2021-08-10 DIAGNOSIS — G5602 Carpal tunnel syndrome, left upper limb: Secondary | ICD-10-CM | POA: Diagnosis not present

## 2021-08-10 DIAGNOSIS — F419 Anxiety disorder, unspecified: Secondary | ICD-10-CM | POA: Diagnosis not present

## 2021-08-10 DIAGNOSIS — Z79899 Other long term (current) drug therapy: Secondary | ICD-10-CM | POA: Diagnosis not present

## 2021-08-10 DIAGNOSIS — I10 Essential (primary) hypertension: Secondary | ICD-10-CM | POA: Diagnosis not present

## 2021-08-10 DIAGNOSIS — J45909 Unspecified asthma, uncomplicated: Secondary | ICD-10-CM | POA: Insufficient documentation

## 2021-08-10 DIAGNOSIS — M1990 Unspecified osteoarthritis, unspecified site: Secondary | ICD-10-CM | POA: Diagnosis not present

## 2021-08-10 HISTORY — DX: Depression, unspecified: F32.A

## 2021-08-10 HISTORY — DX: Prediabetes: R73.03

## 2021-08-10 HISTORY — DX: Other complications of anesthesia, initial encounter: T88.59XA

## 2021-08-10 HISTORY — DX: Unspecified osteoarthritis, unspecified site: M19.90

## 2021-08-10 HISTORY — PX: CARPAL TUNNEL RELEASE: SHX101

## 2021-08-10 HISTORY — DX: Anxiety disorder, unspecified: F41.9

## 2021-08-10 SURGERY — CARPAL TUNNEL RELEASE
Anesthesia: Monitor Anesthesia Care | Laterality: Left

## 2021-08-10 MED ORDER — ACETAMINOPHEN 10 MG/ML IV SOLN: 1000.0000 mg | Freq: Once | INTRAVENOUS | Status: DC | PRN

## 2021-08-10 MED ORDER — ONDANSETRON HCL 4 MG/2ML IJ SOLN
INTRAMUSCULAR | Status: DC | PRN
  Administered 2021-08-10: 4 mg via INTRAVENOUS

## 2021-08-10 MED ORDER — FENTANYL CITRATE (PF) 100 MCG/2ML IJ SOLN
INTRAMUSCULAR | Status: AC
  Filled 2021-08-10: qty 2

## 2021-08-10 MED ORDER — ONDANSETRON HCL 4 MG/2ML IJ SOLN
INTRAMUSCULAR | Status: AC
  Filled 2021-08-10: qty 2

## 2021-08-10 MED ORDER — MIDAZOLAM HCL 5 MG/5ML IJ SOLN
INTRAMUSCULAR | Status: DC | PRN
  Administered 2021-08-10: 2 mg via INTRAVENOUS

## 2021-08-10 MED ORDER — CEFAZOLIN SODIUM-DEXTROSE 2-4 GM/100ML-% IV SOLN
INTRAVENOUS | Status: AC
  Filled 2021-08-10: qty 100

## 2021-08-10 MED ORDER — LACTATED RINGERS IV SOLN: INTRAVENOUS | Status: DC

## 2021-08-10 MED ORDER — LIDOCAINE HCL (PF) 1 % IJ SOLN
INTRAMUSCULAR | Status: AC
  Filled 2021-08-10: qty 5

## 2021-08-10 MED ORDER — FENTANYL CITRATE (PF) 100 MCG/2ML IJ SOLN: 25.0000 ug | INTRAMUSCULAR | Status: DC | PRN

## 2021-08-10 MED ORDER — BUPIVACAINE HCL (PF) 0.25 % IJ SOLN
INTRAMUSCULAR | Status: DC | PRN
  Administered 2021-08-10: 7 mL

## 2021-08-10 MED ORDER — PROPOFOL 500 MG/50ML IV EMUL
INTRAVENOUS | Status: DC | PRN
  Administered 2021-08-10: 100 ug/kg/min via INTRAVENOUS

## 2021-08-10 MED ORDER — HYDROCODONE-ACETAMINOPHEN 5-325 MG PO TABS
1.0000 | ORAL_TABLET | Freq: Four times a day (QID) | ORAL | 0 refills | Status: AC | PRN
Start: 2021-08-10 — End: 2022-08-10

## 2021-08-10 MED ORDER — CEFAZOLIN SODIUM-DEXTROSE 2-4 GM/100ML-% IV SOLN
2.0000 g | INTRAVENOUS | Status: AC
  Administered 2021-08-10: 2 g via INTRAVENOUS

## 2021-08-10 MED ORDER — ONDANSETRON HCL 4 MG/2ML IJ SOLN: 4.0000 mg | Freq: Once | INTRAMUSCULAR | Status: DC | PRN

## 2021-08-10 MED ORDER — FENTANYL CITRATE (PF) 100 MCG/2ML IJ SOLN
INTRAMUSCULAR | Status: DC | PRN
  Administered 2021-08-10: 50 ug via INTRAVENOUS
  Administered 2021-08-10 (×2): 25 ug via INTRAVENOUS

## 2021-08-10 MED ORDER — LIDOCAINE HCL (PF) 1 % IJ SOLN
INTRAMUSCULAR | Status: DC | PRN
  Administered 2021-08-10: 8 mL

## 2021-08-10 MED ORDER — PROPOFOL 500 MG/50ML IV EMUL
INTRAVENOUS | Status: AC
  Filled 2021-08-10: qty 50

## 2021-08-10 MED ORDER — MIDAZOLAM HCL 2 MG/2ML IJ SOLN
INTRAMUSCULAR | Status: AC
  Filled 2021-08-10: qty 2

## 2021-08-10 MED ORDER — BUPIVACAINE HCL (PF) 0.25 % IJ SOLN
INTRAMUSCULAR | Status: AC
  Filled 2021-08-10: qty 30

## 2021-08-10 MED ORDER — VANCOMYCIN HCL 1000 MG IV SOLR
INTRAVENOUS | Status: AC
  Filled 2021-08-10: qty 20

## 2021-08-10 SURGICAL SUPPLY — 38 items
BLADE SURG 15 STRL LF DISP TIS (BLADE) ×4 IMPLANT
BLADE SURG 15 STRL SS (BLADE) ×4
BNDG CMPR 9X4 STRL LF SNTH (GAUZE/BANDAGES/DRESSINGS)
BNDG ELASTIC 3X5.8 VLCR STR LF (GAUZE/BANDAGES/DRESSINGS) ×3 IMPLANT
BNDG ESMARK 4X9 LF (GAUZE/BANDAGES/DRESSINGS) IMPLANT
CORD BIPOLAR FORCEPS 12FT (ELECTRODE) ×3 IMPLANT
COVER BACK TABLE 60X90IN (DRAPES) ×3 IMPLANT
COVER MAYO STAND STRL (DRAPES) ×3 IMPLANT
CUFF TOURN SGL QUICK 18X4 (TOURNIQUET CUFF) IMPLANT
CUFF TOURN SGL QUICK 24 (TOURNIQUET CUFF)
CUFF TRNQT CYL 24X4X16.5-23 (TOURNIQUET CUFF) IMPLANT
DRAPE EXTREMITY T 121X128X90 (DISPOSABLE) ×3 IMPLANT
DRAPE SURG 17X23 STRL (DRAPES) ×3 IMPLANT
DURAPREP 26ML APPLICATOR (WOUND CARE) ×3 IMPLANT
GAUZE SPONGE 4X4 12PLY STRL (GAUZE/BANDAGES/DRESSINGS) ×3 IMPLANT
GAUZE XEROFORM 1X8 LF (GAUZE/BANDAGES/DRESSINGS) ×3 IMPLANT
GLOVE SRG 8 PF TXTR STRL LF DI (GLOVE) ×4 IMPLANT
GLOVE SURG ENC MOIS LTX SZ7.5 (GLOVE) ×6 IMPLANT
GLOVE SURG UNDER POLY LF SZ8 (GLOVE) ×4
GOWN STRL REUS W/ TWL LRG LVL3 (GOWN DISPOSABLE) ×2 IMPLANT
GOWN STRL REUS W/TWL LRG LVL3 (GOWN DISPOSABLE) ×2
GOWN STRL REUS W/TWL XL LVL3 (GOWN DISPOSABLE) ×6 IMPLANT
LOOP VESSEL MAXI BLUE (MISCELLANEOUS) IMPLANT
NDL HYPO 25X1 1.5 SAFETY (NEEDLE) IMPLANT
NEEDLE HYPO 25X1 1.5 SAFETY (NEEDLE) IMPLANT
NS IRRIG 1000ML POUR BTL (IV SOLUTION) ×3 IMPLANT
PACK BASIN DAY SURGERY FS (CUSTOM PROCEDURE TRAY) ×3 IMPLANT
PAD CAST 3X4 CTTN HI CHSV (CAST SUPPLIES) ×2 IMPLANT
PADDING CAST ABS 4INX4YD NS (CAST SUPPLIES) ×1
PADDING CAST ABS COTTON 4X4 ST (CAST SUPPLIES) ×2 IMPLANT
PADDING CAST COTTON 3X4 STRL (CAST SUPPLIES) ×2
SLEEVE SCD COMPRESS KNEE MED (STOCKING) IMPLANT
STOCKINETTE 4X48 STRL (DRAPES) ×3 IMPLANT
SUT ETHILON 3 0 PS 1 (SUTURE) ×3 IMPLANT
SYR BULB EAR ULCER 3OZ GRN STR (SYRINGE) ×3 IMPLANT
SYR CONTROL 10ML LL (SYRINGE) IMPLANT
TOWEL GREEN STERILE FF (TOWEL DISPOSABLE) ×3 IMPLANT
UNDERPAD 30X36 HEAVY ABSORB (UNDERPADS AND DIAPERS) ×3 IMPLANT

## 2021-08-10 NOTE — Transfer of Care (Signed)
Immediate Anesthesia Transfer of Care Note  Patient: Kristen Villarreal  Procedure(s) Performed: LEFT CARPAL TUNNEL RELEASE (Left)  Patient Location: PACU  Anesthesia Type:MAC  Level of Consciousness: awake, alert  and oriented  Airway & Oxygen Therapy: Patient Spontanous Breathing and Patient connected to face mask oxygen  Post-op Assessment: Report given to RN and Post -op Vital signs reviewed and stable  Post vital signs: Reviewed and stable  Last Vitals:  Vitals Value Taken Time  BP    Temp    Pulse    Resp    SpO2      Last Pain:  Vitals:   08/10/21 0910  TempSrc: Oral  PainSc: 10-Worst pain ever      Patients Stated Pain Goal: 3 (67/[id number])  Complications: No notable events documented.

## 2021-08-10 NOTE — H&P (Signed)
Kristen Villarreal is an 66 y.o. female.   Chief Complaint:   Left hand numbness with significant carpal tunnel syndrome HPI: The patient is a 66 year old female with known bilateral carpal tunnel syndrome which is quite significant and confirmed with clinical exam as well as EMG/nerve conduction velocity studies.  At this point given her signs and symptoms combined with nerve conduction studies she does wish to proceed with an open carpal tunnel release with starting with the left side.  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 No pertinent family history. Social History:  reports that she has never smoked. She does not have any smokeless tobacco history on file. She reports current alcohol use. She reports that she does not use drugs.  Allergies:  Allergies  Allergen Reactions   Strawberry Extract Anaphylaxis   Peach Flavor Swelling   Tomato Hives    Medications Prior to Admission  Medication Sig Dispense Refill   albuterol (PROVENTIL HFA;VENTOLIN HFA) 108 (90 BASE) MCG/ACT inhaler Inhale 1-2 puffs into the lungs every 6 (six) hours as needed for wheezing or shortness of breath.     albuterol (PROVENTIL) (2.5 MG/3ML) 0.083% nebulizer solution Take 2.5 mg by nebulization every 6 (six) hours as needed for wheezing or shortness of breath.     ALPRAZolam (XANAX) 0.5 MG tablet Take 0.5 mg by mouth 2 (two) times daily as needed for anxiety.     cetirizine (ZYRTEC) 10 MG tablet Take 10 mg by mouth daily as needed for allergies.     citalopram (CELEXA) 20 MG tablet Take 20 mg by mouth at bedtime.      lisinopril-hydrochlorothiazide  (PRINZIDE,ZESTORETIC) 10-12.5 MG per tablet Take 1 tablet by mouth daily.     traMADol (ULTRAM) 50 MG tablet Take 1-2 tablets (50-100 mg total) by mouth every 6 (six) hours as needed. 40 tablet 0    No results found for this or any previous visit (from the past 48 hour(s)). No results found.  Review of Systems  All other systems reviewed and are negative.  Blood pressure 130/64, pulse 79, temperature 98.1 F (36.7 C), temperature source Oral, resp. rate 18, height 5' (1.524 m), weight 94.1 kg, SpO2 100 %. Physical Exam Vitals reviewed.  Constitutional:      Appearance: Normal appearance.  HENT:     Head: Normocephalic and atraumatic.  Eyes:     Extraocular Movements: Extraocular movements intact.     Pupils: Pupils are equal, round, and reactive to light.  Cardiovascular:     Rate and Rhythm: Normal rate and regular rhythm.  Pulmonary:     Effort: Pulmonary effort is normal.     Breath sounds: Normal breath sounds.  Abdominal:     Palpations: Abdomen is soft.  Musculoskeletal:     Left hand: Decreased strength. Decreased sensation of the median distribution.     Cervical back: Normal range of motion and neck supple.  Neurological:     Mental Status: She is alert and oriented to person, place, and time.  Psychiatric:  Behavior: Behavior normal.     Assessment/Plan Significant carpal tunnel syndrome left upper extremity  The plan is to proceed to surgery today for a left open carpal tunnel release as an outpatient.  The risks and benefits of surgery been explained in detail and informed consent was obtained.  The left hand has been marked.  Mcarthur Rossetti, MD 08/10/2021, 9:31 AM

## 2021-08-10 NOTE — Discharge Instructions (Addendum)
You may use your left hand as comfort allows. Do expect so hand swelling - ice and elevation periodically as needed. Keep your dressing clean and dry for the next 6 days. In 6 days, you may remove your dressings and get your incision wet in the shower. Place large Band Aid over your incision daily starting in 6 days after each shower.     Post Anesthesia Home Care Instructions  Activity: Get plenty of rest for the remainder of the day. A responsible individual must stay with you for 24 hours following the procedure.  For the next 24 hours, DO NOT: -Drive a car -Paediatric nurse -Drink alcoholic beverages -Take any medication unless instructed by your physician -Make any legal decisions or sign important papers.  Meals: Start with liquid foods such as gelatin or soup. Progress to regular foods as tolerated. Avoid greasy, spicy, heavy foods. If nausea and/or vomiting occur, drink only clear liquids until the nausea and/or vomiting subsides. Call your physician if vomiting continues.  Special Instructions/Symptoms: Your throat may feel dry or sore from the anesthesia or the breathing tube placed in your throat during surgery. If this causes discomfort, gargle with warm salt water. The discomfort should disappear within 24 hours.  If you had a scopolamine patch placed behind your ear for the management of post- operative nausea and/or vomiting:  1. The medication in the patch is effective for 72 hours, after which it should be removed.  Wrap patch in a tissue and discard in the trash. Wash hands thoroughly with soap and water. 2. You may remove the patch earlier than 72 hours if you experience unpleasant side effects which may include dry mouth, dizziness or visual disturbances. 3. Avoid touching the patch. Wash your hands with soap and water after contact with the patch.

## 2021-08-10 NOTE — Brief Op Note (Signed)
08/10/2021  10:10 AM  PATIENT:  Kristen Villarreal  66 y.o. female  PRE-OPERATIVE DIAGNOSIS:  severe left carpal tunnel syndrome  POST-OPERATIVE DIAGNOSIS:  severe left carpal tunnel syndrome  PROCEDURE:  Procedure(s): LEFT CARPAL TUNNEL RELEASE (Left)  SURGEON:  Surgeon(s) and Role:    Mcarthur Rossetti, MD - Primary  PHYSICIAN ASSISTANT:  Benita Stabile, PA-C  ANESTHESIA:   local and MAC  EBL:  minimal  COUNTS:  YES  TOURNIQUET:  * Missing tourniquet times found for documented tourniquets in log: [id number] *  DICTATION: .Other Dictation: Dictation Number [id number]  PLAN OF CARE: Discharge to home after PACU  PATIENT DISPOSITION:  PACU - hemodynamically stable.   Delay start of Pharmacological VTE agent (>24hrs) due to surgical blood loss or risk of bleeding: no

## 2021-08-10 NOTE — Op Note (Signed)
NAME: Kristen Villarreal, MITTAL MEDICAL RECORD NO: [id number] ACCOUNT NO: [id number] DATE OF BIRTH: 1956/03/20 FACILITY: [facility] LOCATION: [facility] PHYSICIAN: Lind Guest. Ninfa Linden, MD  Operative Report   DATE OF PROCEDURE: 08/10/2021  PREOPERATIVE DIAGNOSIS:  Significant severe carpal tunnel syndrome, left upper extremity.  POSTOPERATIVE DIAGNOSIS:  Significant severe carpal tunnel syndrome, left upper extremity.  PROCEDURE:  Left open carpal tunnel release.  SURGEON:  Lind Guest. Ninfa Linden, MD  ASSISTANT:  Benita Stabile, PA-C  ANESTHESIA: 1.  Mask ventilation, IV sedation. 2.  Local with 1% plain lidocaine followed with 0.25% plain Marcaine.  TOURNIQUET TIME:  Less than 10 minutes.  ESTIMATED BLOOD LOSS:  Minimal.  ANTIBIOTICS:  2 g IV Ancef.  COMPLICATIONS:  None.  INDICATIONS:  The patient is a 66 year old female well known to me.  She has known and well-documented severe carpal tunnel syndrome confirmed with clinical exam as well as EMG and nerve conduction studies.  At this point, we have recommended open carpal  tunnel release.  We described in detail what the anatomy of the wrist is and described the risks and benefits of this type of surgery and what to expect with intraoperative and postoperative course.  DESCRIPTION OF PROCEDURE:  After informed consent was obtained, appropriate left wrist was marked.  She was brought to the operating room and placed supine on the operating table with left arm on an arm table.  A nonsterile tourniquet was placed around  her upper left arm.  Left forearm, wrist, and hand were prepped and draped with DuraPrep and sterile drapes.  A timeout was called.  She was identified as correct patient, correct left wrist and hand. Mask ventilation and IV sedation was given.  We then  wrapped out the hand with an Esmarch, and the tourniquet was inflated to 250 mm of pressure.  We then anesthetized the palm of the hand over where our incision would be with  first 1% plain lidocaine followed by 0.25% plain Marcaine.  We then made an  incision over the distal edge of the transverse carpal ligament and carried this from distal to proximal.  We were able to identify the transverse carpal ligament from distal to proximal and protect the median nerve while we slowly and meticulously  divided in its entirety.  We assessed the median nerve and found it to be flattened, but intact, and the motor branch was intact.  We then irrigated the soft tissue with normal saline solution.  The skin was reapproximated with interrupted 3-0 nylon  suture.  Xeroform well-padded sterile dressing was applied.  The tourniquet was let down. Her fingers pinked nicely.  She was taken to recovery room in stable condition with all final counts being correct and no complications noted.  Postoperatively, we  will give her wound care instructions and followup instructions as well.   [facility] D: 08/10/2021 10:09:03 am T: 08/10/2021 11:19:00 am  JOB: [id number]/ [id number]

## 2021-08-10 NOTE — Anesthesia Postprocedure Evaluation (Signed)
Anesthesia Post Note  Patient: ISHA SEEFELD  Procedure(s) Performed: LEFT CARPAL TUNNEL RELEASE (Left)     Patient location during evaluation: PACU Anesthesia Type: MAC Level of consciousness: awake and alert Pain management: pain level controlled Vital Signs Assessment: post-procedure vital signs reviewed and stable Respiratory status: spontaneous breathing, nonlabored ventilation, respiratory function stable and patient connected to nasal cannula oxygen Cardiovascular status: stable and blood pressure returned to baseline Postop Assessment: no apparent nausea or vomiting Anesthetic complications: no   No notable events documented.  Last Vitals:  Vitals:   08/10/21 1035 08/10/21 1045  BP:  109/70  Pulse: 64 80  Resp:  18  Temp:  36.6 C  SpO2: 93% 98%    Last Pain:  Vitals:   08/10/21 1045  TempSrc:   PainSc: 0-No pain                 Barnet Glasgow

## 2021-08-11 ENCOUNTER — Encounter (HOSPITAL_BASED_OUTPATIENT_CLINIC_OR_DEPARTMENT_OTHER): Payer: Self-pay | Admitting: Orthopaedic Surgery

## 2021-08-16 ENCOUNTER — Encounter: Payer: Medicare HMO | Admitting: Orthopaedic Surgery

## 2021-08-18 DIAGNOSIS — J4522 Mild intermittent asthma with status asthmaticus: Secondary | ICD-10-CM | POA: Diagnosis not present

## 2021-08-18 DIAGNOSIS — G5603 Carpal tunnel syndrome, bilateral upper limbs: Secondary | ICD-10-CM | POA: Diagnosis not present

## 2021-08-18 DIAGNOSIS — E1142 Type 2 diabetes mellitus with diabetic polyneuropathy: Secondary | ICD-10-CM | POA: Diagnosis not present

## 2021-08-18 DIAGNOSIS — I10 Essential (primary) hypertension: Secondary | ICD-10-CM | POA: Diagnosis not present

## 2021-08-23 ENCOUNTER — Ambulatory Visit (INDEPENDENT_AMBULATORY_CARE_PROVIDER_SITE_OTHER): Payer: Medicare HMO | Admitting: Orthopaedic Surgery

## 2021-08-23 ENCOUNTER — Other Ambulatory Visit: Payer: Self-pay

## 2021-08-23 ENCOUNTER — Encounter: Payer: Self-pay | Admitting: Orthopaedic Surgery

## 2021-08-23 DIAGNOSIS — H10413 Chronic giant papillary conjunctivitis, bilateral: Secondary | ICD-10-CM | POA: Diagnosis not present

## 2021-08-23 DIAGNOSIS — Z9889 Other specified postprocedural states: Secondary | ICD-10-CM

## 2021-08-23 DIAGNOSIS — H5051 Esophoria: Secondary | ICD-10-CM | POA: Diagnosis not present

## 2021-08-23 DIAGNOSIS — H43813 Vitreous degeneration, bilateral: Secondary | ICD-10-CM | POA: Diagnosis not present

## 2021-08-23 DIAGNOSIS — H5213 Myopia, bilateral: Secondary | ICD-10-CM | POA: Diagnosis not present

## 2021-08-23 DIAGNOSIS — G5601 Carpal tunnel syndrome, right upper limb: Secondary | ICD-10-CM

## 2021-08-23 DIAGNOSIS — Z0100 Encounter for examination of eyes and vision without abnormal findings: Secondary | ICD-10-CM | POA: Diagnosis not present

## 2021-08-23 DIAGNOSIS — M3210 Systemic lupus erythematosus, organ or system involvement unspecified: Secondary | ICD-10-CM | POA: Diagnosis not present

## 2021-08-23 DIAGNOSIS — H2513 Age-related nuclear cataract, bilateral: Secondary | ICD-10-CM | POA: Diagnosis not present

## 2021-08-23 DIAGNOSIS — H04123 Dry eye syndrome of bilateral lacrimal glands: Secondary | ICD-10-CM | POA: Diagnosis not present

## 2021-08-23 DIAGNOSIS — G5602 Carpal tunnel syndrome, left upper limb: Secondary | ICD-10-CM

## 2021-08-23 DIAGNOSIS — R7309 Other abnormal glucose: Secondary | ICD-10-CM | POA: Diagnosis not present

## 2021-08-23 DIAGNOSIS — H40013 Open angle with borderline findings, low risk, bilateral: Secondary | ICD-10-CM | POA: Diagnosis not present

## 2021-08-23 NOTE — Progress Notes (Signed)
The patient is 2 weeks status post a left open carpal tunnel release.  She is doing well.  I did remove the sutures in place and Steri-Strips.  Her incision is tried to dehisce a little bit but I am not worried about it based on how it looks.  I talked her about wound care and placed a dressing on it that she will keep on for the next 2 days.  When the Steri-Strips fall off she can place Vaseline on the wound daily.  I would like to see her back in 2 weeks for wound check.  She does have known carpal tunnel syndrome on the right side that we will address later when she heals of the left hand.

## 2021-09-06 ENCOUNTER — Ambulatory Visit (INDEPENDENT_AMBULATORY_CARE_PROVIDER_SITE_OTHER): Payer: No Typology Code available for payment source | Admitting: Orthopaedic Surgery

## 2021-09-06 ENCOUNTER — Other Ambulatory Visit: Payer: Self-pay

## 2021-09-06 ENCOUNTER — Encounter: Payer: Self-pay | Admitting: Orthopaedic Surgery

## 2021-09-06 VITALS — Ht 60.0 in | Wt 207.0 lb

## 2021-09-06 DIAGNOSIS — Z9889 Other specified postprocedural states: Secondary | ICD-10-CM

## 2021-09-06 DIAGNOSIS — G5601 Carpal tunnel syndrome, right upper limb: Secondary | ICD-10-CM

## 2021-09-06 DIAGNOSIS — G5602 Carpal tunnel syndrome, left upper limb: Secondary | ICD-10-CM

## 2021-09-06 NOTE — Progress Notes (Signed)
The patient is now about 4 weeks out from a left carpal tunnel release.  She has severe bilateral carpal tunnel syndrome.  I wanted to see her back this early because at her 2-week visit I was worried a little bit about the incision.  She says is doing great and she has no pain and reports good function of her hand.  Examination of her left hand incision looks great.  It is healing over nicely.  She does have improved grip and pinch strength.  She wants to delay carpal tunnel release on her other side for a while and I agree with this until she gets better function of her left hand.  We will see her back in 3 months to see how she is doing overall and to see if she wants to consider scheduling right carpal tunnel surgery release.

## 2021-10-09 DIAGNOSIS — E1142 Type 2 diabetes mellitus with diabetic polyneuropathy: Secondary | ICD-10-CM | POA: Diagnosis not present

## 2021-10-09 DIAGNOSIS — J4522 Mild intermittent asthma with status asthmaticus: Secondary | ICD-10-CM | POA: Diagnosis not present

## 2021-10-09 DIAGNOSIS — I10 Essential (primary) hypertension: Secondary | ICD-10-CM | POA: Diagnosis not present

## 2021-11-24 ENCOUNTER — Other Ambulatory Visit: Payer: Self-pay | Admitting: Internal Medicine

## 2021-11-24 DIAGNOSIS — I10 Essential (primary) hypertension: Secondary | ICD-10-CM | POA: Diagnosis not present

## 2021-11-24 DIAGNOSIS — E7849 Other hyperlipidemia: Secondary | ICD-10-CM | POA: Diagnosis not present

## 2021-11-24 DIAGNOSIS — Z1239 Encounter for other screening for malignant neoplasm of breast: Secondary | ICD-10-CM | POA: Diagnosis not present

## 2021-11-24 DIAGNOSIS — E559 Vitamin D deficiency, unspecified: Secondary | ICD-10-CM | POA: Diagnosis not present

## 2021-11-24 DIAGNOSIS — Z0000 Encounter for general adult medical examination without abnormal findings: Secondary | ICD-10-CM | POA: Diagnosis not present

## 2021-11-24 DIAGNOSIS — S93402A Sprain of unspecified ligament of left ankle, initial encounter: Secondary | ICD-10-CM | POA: Diagnosis not present

## 2021-11-24 DIAGNOSIS — J4522 Mild intermittent asthma with status asthmaticus: Secondary | ICD-10-CM | POA: Diagnosis not present

## 2021-11-24 DIAGNOSIS — E1142 Type 2 diabetes mellitus with diabetic polyneuropathy: Secondary | ICD-10-CM | POA: Diagnosis not present

## 2021-11-25 LAB — LIPID PANEL
Cholesterol: 159 mg/dL (ref <200)
HDL: 48 mg/dL — ABNORMAL LOW (ref >=50)
LDL Cholesterol (Calc): 94 mg/dL (calc)
Non-HDL Cholesterol (Calc): 111 mg/dL (calc) (ref <130)
Total CHOL/HDL Ratio: 3.3 (calc) (ref <5.0)
Triglycerides: 76 mg/dL (ref <150)

## 2021-11-25 LAB — COMPLETE METABOLIC PANEL WITH GFR
AG Ratio: 1.3 (calc) (ref 1.0–2.5)
ALT: 24 U/L (ref 6–29)
AST: 21 U/L (ref 10–35)
Albumin: 4 g/dL (ref 3.6–5.1)
Alkaline phosphatase (APISO): 82 U/L (ref 37–153)
BUN/Creatinine Ratio: 14 (calc) (ref 6–22)
BUN: 20 mg/dL (ref 7–25)
CO2: 23 mmol/L (ref 20–32)
Calcium: 9.5 mg/dL (ref 8.6–10.4)
Chloride: 107 mmol/L (ref 98–110)
Creat: 1.41 mg/dL — ABNORMAL HIGH (ref 0.50–1.05)
Globulin: 3.2 g/dL (calc) (ref 1.9–3.7)
Glucose, Bld: 103 mg/dL — ABNORMAL HIGH (ref 65–99)
Potassium: 4.3 mmol/L (ref 3.5–5.3)
Sodium: 142 mmol/L (ref 135–146)
Total Bilirubin: 0.3 mg/dL (ref 0.2–1.2)
Total Protein: 7.2 g/dL (ref 6.1–8.1)
eGFR: 41 mL/min/{1.73_m2} — ABNORMAL LOW (ref >=60)

## 2021-11-25 LAB — CBC
HCT: 34 % — ABNORMAL LOW (ref 35.0–45.0)
Hemoglobin: 10.6 g/dL — ABNORMAL LOW (ref 11.7–15.5)
MCH: 23.2 pg — ABNORMAL LOW (ref 27.0–33.0)
MCHC: 31.2 g/dL — ABNORMAL LOW (ref 32.0–36.0)
MCV: 74.4 fL — ABNORMAL LOW (ref 80.0–100.0)
MPV: 10.5 fL (ref 7.5–12.5)
Platelets: 296 10*3/uL (ref 140–400)
RBC: 4.57 10*6/uL (ref 3.80–5.10)
RDW: 16.5 % — ABNORMAL HIGH (ref 11.0–15.0)
WBC: 5.8 10*3/uL (ref 3.8–10.8)

## 2021-11-25 LAB — TSH: TSH: 2.05 mIU/L (ref 0.40–4.50)

## 2021-11-25 LAB — VITAMIN D 25 HYDROXY (VIT D DEFICIENCY, FRACTURES): Vit D, 25-Hydroxy: 73 ng/mL (ref 30–100)

## 2021-12-04 ENCOUNTER — Ambulatory Visit (INDEPENDENT_AMBULATORY_CARE_PROVIDER_SITE_OTHER): Payer: No Typology Code available for payment source | Admitting: Orthopaedic Surgery

## 2021-12-04 ENCOUNTER — Encounter: Payer: Self-pay | Admitting: Orthopaedic Surgery

## 2021-12-04 DIAGNOSIS — Z9889 Other specified postprocedural states: Secondary | ICD-10-CM | POA: Diagnosis not present

## 2021-12-04 NOTE — Progress Notes (Signed)
The patient is now 4 months status post a left open carpal tunnel release.  Her carpal tunnel disease is quite severe in both sides.  She wants to wait until maybe next year for a right carpal tunnel release.  She says she is getting there in terms of getting some of her motion and strength back.  She does have modalities at home that she uses to get her hand stronger and this was left over from her husband's hand surgery. ? ?She does have weak grip strength bilaterally but it has improved.  Her left hand incision is healed and there is no evidence of muscle atrophy.  She still has a slight decrease sensation in the median nerve distribution but this is also improved. ? ?At this point follow-up for the left side is as needed.  She said she will call us when he gets to the point she wants to proceed with for carpal tunnel surgery on the right side.  All questions and concerns were answered and addressed. ?

## 2022-02-13 ENCOUNTER — Other Ambulatory Visit: Payer: Self-pay | Admitting: Internal Medicine

## 2022-02-13 DIAGNOSIS — Z1231 Encounter for screening mammogram for malignant neoplasm of breast: Secondary | ICD-10-CM

## 2022-02-15 ENCOUNTER — Ambulatory Visit
Admission: RE | Admit: 2022-02-15 | Discharge: 2022-02-15 | Disposition: A | Discharge status: Home / Self Care | Payer: No Typology Code available for payment source | Source: Ambulatory Visit | Attending: Internal Medicine | Admitting: Internal Medicine

## 2022-02-15 DIAGNOSIS — Z1231 Encounter for screening mammogram for malignant neoplasm of breast: Secondary | ICD-10-CM

## 2022-02-19 ENCOUNTER — Encounter: Payer: Self-pay | Admitting: Physician Assistant

## 2022-02-19 ENCOUNTER — Ambulatory Visit (INDEPENDENT_AMBULATORY_CARE_PROVIDER_SITE_OTHER): Payer: No Typology Code available for payment source | Admitting: Physician Assistant

## 2022-02-19 ENCOUNTER — Ambulatory Visit (INDEPENDENT_AMBULATORY_CARE_PROVIDER_SITE_OTHER): Payer: No Typology Code available for payment source

## 2022-02-19 DIAGNOSIS — M25562 Pain in left knee: Secondary | ICD-10-CM

## 2022-02-19 MED ORDER — LIDOCAINE HCL 1 % IJ SOLN
3.0000 mL | INTRAMUSCULAR | Status: AC | PRN
  Administered 2022-02-19: 3 mL

## 2022-02-19 MED ORDER — METHYLPREDNISOLONE ACETATE 40 MG/ML IJ SUSP
40.0000 mg | INTRAMUSCULAR | Status: AC | PRN
  Administered 2022-02-19: 40 mg via INTRA_ARTICULAR

## 2022-02-19 NOTE — Progress Notes (Addendum)
Office Visit Note   Patient: Kristen Villarreal           Date of Birth: November 04, 1955           MRN: [id number] Visit Date: 02/19/2022              Requested by: Nolene Ebbs, MD [address] [place],  [place] [address] PCP: Nolene Ebbs, MD   Assessment & Plan: Visit Diagnoses:  1. Left knee pain, unspecified chronicity     Plan: She will do quad strengthening exercises.  We will see her back in just 2 weeks to see what type of response she had to the exercises in the cortisone injection left knee.  Follow-Up Instructions: Return in about 2 weeks (around 03/05/2022).   Orders:  Orders Placed This Encounter  Procedures   Large Joint Inj: L knee   XR Knee 1-2 Views Left   No orders of the defined types were placed in this encounter.     Procedures: Large Joint Inj: L knee on 02/19/2022 9:07 AM Indications: pain Details: 22 G 1.5 in needle, anterolateral approach  Arthrogram: No  Medications: 3 mL lidocaine 1 %; 40 mg methylPREDNISolone acetate 40 MG/ML Outcome: tolerated well, no immediate complications Procedure, treatment alternatives, risks and benefits explained, specific risks discussed. Consent was given by the patient. Immediately prior to procedure a time out was called to verify the correct patient, procedure, equipment, support staff and site/side marked as required. Patient was prepped and draped in the usual sterile fashion.       Clinical Data: No additional findings.   Subjective: Chief Complaint  Patient presents with   Left Knee - Pain    HPI Mrs. Sherley is well-known to Dr. Trevor Mace service comes in today with left knee pain.  She has had left knee pain since May 10 she stepped out of her husband's truck and twisted her knee.  Since that time she has had pain medial aspect of the knee which she describes as sharp especially when going from a seated position to standing.  She has tried Voltaren gel, heat ice soaking the knee.  She denies any  swelling in the knee.  Legs giving way of the knee otherwise no mechanical symptoms.  She reports being a borderline diabetic but takes no medications.  Review of Systems  Constitutional:  Negative for chills and fever.     Objective: Vital Signs: There were no vitals taken for this visit.  Physical Exam Constitutional:      Appearance: She is normal weight. She is not ill-appearing or diaphoretic.  Pulmonary:     Effort: Pulmonary effort is normal.  Psychiatric:        Mood and Affect: Mood normal.     Ortho Exam Bilateral knees: No abnormal warmth erythema or effusion.  Full almost hyperextension of both knees.  No instability valgus varus stressing of either knee.  McMurray's positive on the left only.  Full range of motion of both knees.  Tenderness along medial joint line the left knee on the left. Specialty Comments:  No specialty comments available.  Imaging: XR Knee 1-2 Views Left  Result Date: 02/19/2022 Left knee 2 views: Mild periarticular osteophyte involving the medial compartment.  Mild patellofemoral changes.  Otherwise no acute fractures or bony abnormalities.  Knee is well located.  Normal bone density.    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   Complication of anesthesia    woke up during foot surgery   Depression    Hypercholesteremia    Hypertension    Pre-diabetes     Family History  Problem Relation Age of Onset   Breast cancer Neg Hx     Past Surgical History:  Procedure Laterality Date   ABDOMINAL HYSTERECTOMY     CARPAL TUNNEL RELEASE Left 08/10/2021   Procedure: LEFT CARPAL TUNNEL RELEASE;  Surgeon: Mcarthur Rossetti, MD;  Location: [facility];  Service: Orthopedics;  Laterality: Left;   FOOT SURGERY Bilateral     Social History   Occupational History   Not on file  Tobacco Use   Smoking status: Never   Smokeless tobacco: Not on file  Substance and Sexual Activity   Alcohol use: Yes    Comment: rarely   Drug use: Never   Sexual activity: Not on file

## 2022-02-26 DIAGNOSIS — M179 Osteoarthritis of knee, unspecified: Secondary | ICD-10-CM | POA: Diagnosis not present

## 2022-02-26 DIAGNOSIS — I10 Essential (primary) hypertension: Secondary | ICD-10-CM | POA: Diagnosis not present

## 2022-02-26 DIAGNOSIS — J4522 Mild intermittent asthma with status asthmaticus: Secondary | ICD-10-CM | POA: Diagnosis not present

## 2022-02-26 DIAGNOSIS — E1142 Type 2 diabetes mellitus with diabetic polyneuropathy: Secondary | ICD-10-CM | POA: Diagnosis not present

## 2022-03-05 ENCOUNTER — Encounter: Payer: Self-pay | Admitting: Physician Assistant

## 2022-03-05 ENCOUNTER — Ambulatory Visit (INDEPENDENT_AMBULATORY_CARE_PROVIDER_SITE_OTHER): Payer: No Typology Code available for payment source | Admitting: Physician Assistant

## 2022-03-05 DIAGNOSIS — M25562 Pain in left knee: Secondary | ICD-10-CM

## 2022-03-05 NOTE — Addendum Note (Signed)
Addended by: Robyne Peers on: 03/05/2022 04:04 PM   Modules accepted: Orders

## 2022-03-05 NOTE — Progress Notes (Signed)
HPI: Mrs. Hewes comes in today for follow-up of left knee injection 02/19/2022.  She states the knee is still swelling and painful.  She got minimal relief with the injection.  She is taking Aleve.  Notes that the home exercises made the pain worse.  She notes she is now having giving way sensation in the hands shooting pains within the knee mostly medial aspect.  She notes if she tries to sleep at night on her side she is unable to tolerate her right knee being on top of the left knee.  Review of systems see HPI otherwise negative or noncontributory.  Physical exam: General well-developed well-nourished female in no acute distress ambulates without any assistive device.  Positive antalgic gait.  Left knee: No abnormal warmth erythema or effusion.  No instability valgus varus stressing.  Tenderness along medial joint line.  McMurray's positive.  Impression: Left knee pain  Plan: Given patient's continued pain in the knee despite conservative management which is included medications and cortisone injection along with quad strengthening exercises along with mild arthritic changes involving the medial compartment and patellofemoral compartment.  Recommend MRI to rule out meniscal tear.  Will have her undergo the MRI then follow-up with [place] once that study is available.  Questions were encouraged and answered at length.  Did discuss with her using a cane in her right hand to offload her left knee and also for safety concerns.

## 2022-03-18 ENCOUNTER — Ambulatory Visit
Admission: RE | Admit: 2022-03-18 | Discharge: 2022-03-18 | Disposition: A | Discharge status: Home / Self Care | Payer: No Typology Code available for payment source | Source: Ambulatory Visit | Attending: Physician Assistant | Admitting: Physician Assistant

## 2022-03-18 DIAGNOSIS — M25562 Pain in left knee: Secondary | ICD-10-CM | POA: Diagnosis not present

## 2022-03-26 ENCOUNTER — Ambulatory Visit (INDEPENDENT_AMBULATORY_CARE_PROVIDER_SITE_OTHER): Payer: No Typology Code available for payment source | Admitting: Physician Assistant

## 2022-03-26 ENCOUNTER — Encounter: Payer: Self-pay | Admitting: Physician Assistant

## 2022-03-26 DIAGNOSIS — M1712 Unilateral primary osteoarthritis, left knee: Secondary | ICD-10-CM

## 2022-03-26 NOTE — Progress Notes (Signed)
HPI: Ms. Pollitt returns today to go over the MRI of her left knee.  She states she feels like the pain in her knee is getting worse.  Pains medial aspect of the knee.  She has pain when transitioning from a sitting to standing position.  Feels as if the knee will give way at times.  She is using Voltaren gel medial sided knee 2 times a day.  She is taking occasional Aleve but no more than once daily. MRI reviewed with the patient.  There is no meniscal tear.  There is only mild partial thinning of the medial compartment with marginal osteophytes.  Small patellofemoral joint effusion.  No other abnormalities.   Review of systems: See HPI otherwise negative or noncontributory.  Physical exam: General well-developed well-nourished female who ambulates with a cane with a slow antalgic gait.  Left knee full extension full flexion.  Tenderness along medial joint line.  No instability valgus varus stressing.  No abnormal warmth erythema or effusion.  Impression: Left knee osteoarthritis  Plan: We will send her to formal therapy to work on quad strengthening, range of motion, modalities and home exercise program.  Have her take Aleve 2 tablets twice daily for 2 weeks and then go back to as needed.  Voltaren gel medial compartment of the knee 4 times daily. She will follow-up with [place] in 6 weeks see how she is doing overall she may benefit from supplemental injection in the future.  Questions were encouraged and answered

## 2022-04-12 ENCOUNTER — Telehealth: Payer: Self-pay | Admitting: Physician Assistant

## 2022-04-12 DIAGNOSIS — M1712 Unilateral primary osteoarthritis, left knee: Secondary | ICD-10-CM

## 2022-04-12 NOTE — Telephone Encounter (Signed)
Patient called. She would like a referral for PT. Her call back number is [phone]

## 2022-04-13 ENCOUNTER — Ambulatory Visit (INDEPENDENT_AMBULATORY_CARE_PROVIDER_SITE_OTHER): Payer: No Typology Code available for payment source | Admitting: Physical Therapy

## 2022-04-13 ENCOUNTER — Encounter: Payer: Self-pay | Admitting: Physical Therapy

## 2022-04-13 ENCOUNTER — Other Ambulatory Visit: Payer: Self-pay

## 2022-04-13 DIAGNOSIS — M6281 Muscle weakness (generalized): Secondary | ICD-10-CM

## 2022-04-13 DIAGNOSIS — M25562 Pain in left knee: Secondary | ICD-10-CM

## 2022-04-13 DIAGNOSIS — R600 Localized edema: Secondary | ICD-10-CM

## 2022-04-13 DIAGNOSIS — G8929 Other chronic pain: Secondary | ICD-10-CM | POA: Diagnosis not present

## 2022-04-13 NOTE — Therapy (Addendum)
OUTPATIENT PHYSICAL THERAPY LOWER EXTREMITY EVALUATION   Patient Name: Kristen Villarreal MRN: [id number] DOB:22-Jul-1956, 66 y.o., female Today's Date: 04/13/2022   PT End of Session - 04/13/22 0926     Visit Number 1    Number of Visits 12    Date for PT Re-Evaluation 06/08/22    Authorization Type devoted health    PT Start Time 0854    PT Stop Time 0926    PT Time Calculation (min) 32 min    Activity Tolerance Patient limited by pain    Behavior During Therapy [facility] for tasks assessed/performed             Past Medical History:  Diagnosis Date   Anxiety    Arthritis    left shoulder   Asthma    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   Complication of anesthesia    woke up during foot surgery   Depression    Hypercholesteremia    Hypertension    Pre-diabetes    Past Surgical History:  Procedure Laterality Date   ABDOMINAL HYSTERECTOMY     CARPAL TUNNEL RELEASE Left 08/10/2021   Procedure: LEFT CARPAL TUNNEL RELEASE;  Surgeon: Mcarthur Rossetti, MD;  Location: [place];  Service: Orthopedics;  Laterality: Left;   FOOT SURGERY Bilateral    Patient Active Problem List   Diagnosis Date Noted   Carpal tunnel syndrome, left upper limb 08/10/2021    PCP: Nolene Ebbs, MD  REFERRING PROVIDER: Pete Pelt, PA-C  REFERRING DIAG: [id number] (ICD-10-CM) - Primary osteoarthritis of left knee  THERAPY DIAG:  Chronic pain of left knee  Muscle weakness (generalized)  Localized edema  Rationale for Evaluation and Treatment Rehabilitation  ONSET DATE: 12/06/21 fell  SUBJECTIVE:   SUBJECTIVE STATEMENT: She relays she hurt her knee 12/06/21 trying to climb up into truck and fell. She has had pain since and had knee MRI, then was referred to try PT. She feels like her left knee will give out on her. She was been using [facility] since July 2023.  PERTINENT HISTORY: PMH:anx,knee OA,ovarian Ca,Dep,HTN  PAIN:  Are you having pain? Yes: NPRS scale:  9.5/10 Pain location: left medial knee Pain description: weird pain, sharp and dull Aggravating factors: walking, rain Relieving factors: has tried heat and ice and alieve but no relief from this.  PRECAUTIONS: None  WEIGHT BEARING RESTRICTIONS No  FALLS:  Has patient fallen in last 6 months? Yes. Number of falls 1, she is fearful of falling  OCCUPATION: retired  PLOF: Independent with basic ADLs  PATIENT GOALS : be able to walk without cane.    OBJECTIVE:   DIAGNOSTIC FINDINGS:  Knee MRI IMPRESSION: 1. Mild medial tibiofemoral arthritis with articular cartilage thinning and small marginal osteophytes. 2. Small suprapatellar joint effusion. 3. Cruciate and collateral ligaments are intact. Medial and lateral menisci are maintained. No Evidence of fracture or osteonecrosis. Muscles, skin and subcutaneous soft tissues are unremarkable.  PATIENT SURVEYS:  FOTO will do after this session or next session due to time constraints  COGNITION:  Overall cognitive status: Within functional limits for tasks assessed     SENSATION: WFL  EDEMA:  Mild to moderate edema left medial knee  MUSCLE LENGTH: Tightness noted in left hamstrings and quads  POSTURE: slumped posture  PALPATION: Very tender to palpation left medial knee.  LOWER EXTREMITY ROM:  Active ROM/PROM Left eval Rt eval  Hip flexion    Hip extension    Hip abduction  Hip adduction    Hip internal rotation    Hip external rotation    Knee flexion 65/75 (empty end feel and muscle guarding due to pain)   Knee extension 8/5   Ankle dorsiflexion    Ankle plantarflexion    Ankle inversion    Ankle eversion     (Blank rows = not tested)  LOWER EXTREMITY MMT:  MMT Left eval  Hip flexion 2  Hip extension   Hip abduction 2  Hip adduction   Hip internal rotation   Hip external rotation   Knee flexion 2  Knee extension 2  Ankle dorsiflexion   Ankle plantarflexion   Ankle inversion   Ankle eversion     (Blank rows = not tested)  LOWER EXTREMITY SPECIAL TESTS:    FUNCTIONAL TESTS:  Timed up and go (TUG): 1:06 with SPC  GAIT:Eval: Distance walked: 50 feet Assistive device utilized: Single point cane Level of assistance: Modified independence/supervision Comments: very slow velocity, antalgic gait, gait instability    TODAY'S TREATMENT:  Eval HEP creation and review, see below for details   PATIENT EDUCATION: Education details: HEP, PT plan of care Person educated: Patient Education method: Explanation, Demonstration, Verbal cues, and Handouts Education comprehension: verbalized understanding and needs further education   HOME EXERCISE PROGRAM: Access Code: [email or web address] URL: [email or web address] Date: 04/13/2022 Prepared by: Elsie Ra  Exercises - Seated Knee Flexion Stretch  - 2 x daily - 6 x weekly - 1 sets - 10 reps - 5 sec hold - Seated Straight Leg Raise with Quad Contraction  - 2 x daily - 6 x weekly - 1 sets - 10 reps - Seated Long Arc Quad  - 2 x daily - 6 x weekly - 2 sets - 10 reps - alternating marching  - 2 x daily - 6 x weekly - 1-2 sets - 10 reps  ASSESSMENT:  CLINICAL IMPRESSION: Patient referred to PT for Left knee pain with OA. She has significant knee weakness, knee tightness, and gait instability.  PA recommending to work on quad strengthening, range of motion, modalities and home exercise program. Patient will benefit from skilled PT to address below impairments, limitations and improve overall function.  OBJECTIVE IMPAIRMENTS: decreased activity tolerance, difficulty walking, decreased balance, decreased endurance, decreased mobility, decreased ROM, decreased strength, impaired flexibility, impaired LE use, postural dysfunction, and pain.  ACTIVITY LIMITATIONS: bending, lifting, carry, locomotion, cleaning, community activity, driving  PERSONAL FACTORS:  PMH:anx,OA,ovarian Ca,Dep,HTN are also affecting patient's functional  outcome.  REHAB POTENTIAL: Good  CLINICAL DECISION MAKING: Stable/uncomplicated  EVALUATION COMPLEXITY: Low    GOALS: Short term PT Goals Target date: 05/11/2022 Pt will be I and compliant with HEP. Baseline:  Goal status: New Pt will decrease pain by 25% overall Baseline:9.5 Goal status: New  Long term PT goals Target date: 06/08/2022 Pt will improve knee AROM to [facility] to improve functional mobility Baseline: Goal status: New Pt will improve  hip/knee strength to at least 4/5 MMT to improve functional strength Baseline: Goal status: New Pt will improve FOTO to at least %predicted goal functional to show improved function Baseline: Goal status: New Pt will reduce pain by overall 50% overall with usual activity Baseline:9.5 Goal status: New Pt will be able to ambulate at least 300 ft LRAD Baseline: Goal status: New  PLAN: PT FREQUENCY: 1-2 times per week   PT DURATION: 6-8 weeks  PLANNED INTERVENTIONS (unless contraindicated): aquatic PT, Canalith repositioning, cryotherapy, Electrical stimulation, Iontophoresis with 4 mg/ml dexamethasome, Moist heat, traction, Ultrasound,  gait training, Therapeutic exercise, balance training, neuromuscular re-education, patient/family education, prosthetic training, manual techniques, passive ROM, dry needling, taping, vasopnuematic device, vestibular, spinal manipulations, joint manipulations  PLAN FOR NEXT SESSION: FOTO if she did not finish it after last visit, review HEP, quad strength and knee ROM as tolerated. Gait and balance as tolerated.    Debbe Odea, PT,DPT 04/13/2022, 9:27 AM

## 2022-04-17 ENCOUNTER — Encounter: Payer: No Typology Code available for payment source | Admitting: Physical Therapy

## 2022-04-23 ENCOUNTER — Telehealth: Payer: Self-pay | Admitting: Physical Therapy

## 2022-04-23 ENCOUNTER — Encounter: Payer: No Typology Code available for payment source | Admitting: Physical Therapy

## 2022-04-23 NOTE — Telephone Encounter (Signed)
Pt did not show for PT appointment today. They were contacted and informed of this. She states she thought her appointment was for tomorrow.  She was provided the date and time of their next appointment and she confirms she will be there.  Elsie Ra, PT, DPT 04/23/22 9:52 AM

## 2022-04-26 ENCOUNTER — Encounter: Payer: Self-pay | Admitting: Physical Therapy

## 2022-04-26 ENCOUNTER — Ambulatory Visit (INDEPENDENT_AMBULATORY_CARE_PROVIDER_SITE_OTHER): Payer: No Typology Code available for payment source | Admitting: Physical Therapy

## 2022-04-26 DIAGNOSIS — G8929 Other chronic pain: Secondary | ICD-10-CM | POA: Diagnosis not present

## 2022-04-26 DIAGNOSIS — M25562 Pain in left knee: Secondary | ICD-10-CM

## 2022-04-26 DIAGNOSIS — R600 Localized edema: Secondary | ICD-10-CM

## 2022-04-26 DIAGNOSIS — M6281 Muscle weakness (generalized): Secondary | ICD-10-CM | POA: Diagnosis not present

## 2022-04-26 NOTE — Therapy (Signed)
OUTPATIENT PHYSICAL THERAPY TREATMENT NOTE   Patient Name: Kristen Villarreal MRN: [id number] DOB:Aug 18, 1955, 66 y.o., female Today's Date: 04/26/2022  END OF SESSION:   PT End of Session - 04/26/22 0947     Visit Number 2    Number of Visits 12    Date for PT Re-Evaluation 06/08/22    Authorization Type devoted health    PT Start Time 0940    PT Stop Time 1018    PT Time Calculation (min) 38 min    Activity Tolerance Patient limited by pain    Behavior During Therapy [facility] for tasks assessed/performed             Past Medical History:  Diagnosis Date   Anxiety    Arthritis    left shoulder   Asthma    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   Complication of anesthesia    woke up during foot surgery   Depression    Hypercholesteremia    Hypertension    Pre-diabetes    Past Surgical History:  Procedure Laterality Date   ABDOMINAL HYSTERECTOMY     CARPAL TUNNEL RELEASE Left 08/10/2021   Procedure: LEFT CARPAL TUNNEL RELEASE;  Surgeon: Mcarthur Rossetti, MD;  Location: [place];  Service: Orthopedics;  Laterality: Left;   FOOT SURGERY Bilateral    Patient Active Problem List   Diagnosis Date Noted   Carpal tunnel syndrome, left upper limb 08/10/2021     THERAPY DIAG:  Chronic pain of left knee  Muscle weakness (generalized)  Localized edema   PCP: Nolene Ebbs, MD   REFERRING PROVIDER: Pete Pelt, PA-C   REFERRING DIAG: [id number] (ICD-10-CM) - Primary osteoarthritis of left knee  Rationale for Evaluation and Treatment Rehabilitation   ONSET DATE: 12/06/21 fell   SUBJECTIVE:    SUBJECTIVE STATEMENT: She relays she has been doing her HEP, relays pain is 7/10 today in her left knee   PERTINENT HISTORY: PMH:anx,knee OA,ovarian Ca,Dep,HTN   PAIN:  Are you having pain? Yes: NPRS scale: 7/10 Pain location: left medial knee Pain description: weird pain, sharp and dull Aggravating factors: walking, rain Relieving factors: has  tried heat and ice and alieve but no relief from this.   PRECAUTIONS: None   WEIGHT BEARING RESTRICTIONS No   FALLS:  Has patient fallen in last 6 months? Yes. Number of falls 1, she is fearful of falling   OCCUPATION: retired   PLOF: Independent with basic ADLs   PATIENT GOALS : be able to walk without cane.      OBJECTIVE:    DIAGNOSTIC FINDINGS:  Knee MRI IMPRESSION: 1. Mild medial tibiofemoral arthritis with articular cartilage thinning and small marginal osteophytes. 2. Small suprapatellar joint effusion. 3. Cruciate and collateral ligaments are intact. Medial and lateral menisci are maintained. No Evidence of fracture or osteonecrosis. Muscles, skin and subcutaneous soft tissues are unremarkable.   PATIENT SURVEYS:  04/26/22: FOTO 47% functional, goal is 59%   COGNITION:           Overall cognitive status: Within functional limits for tasks assessed                          SENSATION: WFL   EDEMA:  Mild to moderate edema left medial knee   MUSCLE LENGTH: Tightness noted in left hamstrings and quads   POSTURE: slumped posture   PALPATION: Very tender to palpation left medial knee.   LOWER EXTREMITY ROM:  Active ROM/PROM Left eval Left 04/26/22  Hip flexion      Hip extension      Hip abduction      Hip adduction      Hip internal rotation      Hip external rotation      Knee flexion 65/75 (empty end feel and muscle guarding due to pain) /90   Knee extension 8/5    Ankle dorsiflexion      Ankle plantarflexion      Ankle inversion      Ankle eversion       (Blank rows = not tested)   LOWER EXTREMITY MMT:   MMT Left eval  Hip flexion 2  Hip extension    Hip abduction 2  Hip adduction    Hip internal rotation    Hip external rotation    Knee flexion 2  Knee extension 2  Ankle dorsiflexion    Ankle plantarflexion    Ankle inversion    Ankle eversion     (Blank rows = not tested)   LOWER EXTREMITY SPECIAL TESTS:      FUNCTIONAL  TESTS:  Timed up and go (TUG): 1:06 with SPC   GAIT:Eval: Distance walked: 50 feet Assistive device utilized: Single point cane Level of assistance: Modified independence/supervision Comments: very slow velocity, antalgic gait, gait instability       TODAY'S TREATMENT:  04/26/22 -Nu step L5 X 6 min -Slantoboard stretch 3 X 30 sec -Heel and toe raises X 15 -Step ups onto 4 inch step  X10 leading with left leg -Sit to stands X 15, no UE support from 22.5 inch table -seated LAQ 2# 2X10 -Seated SLR X 10 on left -FOTO intake  -Manual therapy: PROM with overpressure into flexion for left knee in sitting     PATIENT EDUCATION: Education details: HEP, PT plan of care Person educated: Patient Education method: Explanation, Demonstration, Verbal cues, and Handouts Education comprehension: verbalized understanding and needs further education     HOME EXERCISE PROGRAM: Access Code: [email or web address] URL: [email or web address] Date: 04/13/2022 Prepared by: Elsie Ra   Exercises - Seated Knee Flexion Stretch  - 2 x daily - 6 x weekly - 1 sets - 10 reps - 5 sec hold - Seated Straight Leg Raise with Quad Contraction  - 2 x daily - 6 x weekly - 1 sets - 10 reps - Seated Long Arc Quad  - 2 x daily - 6 x weekly - 2 sets - 10 reps - alternating marching  - 2 x daily - 6 x weekly - 1-2 sets - 10 reps   ASSESSMENT:   CLINICAL IMPRESSION: Pain a little better from eval and she was able to perform LAQ without as much difficulty at home so we added a little weight to this today in session with good tolerance. Knee flexion PROM also had improved some since her first visit. Continue POC.   OBJECTIVE IMPAIRMENTS: decreased activity tolerance, difficulty walking, decreased balance, decreased endurance, decreased mobility, decreased ROM, decreased strength, impaired flexibility, impaired LE use, postural dysfunction, and pain.   ACTIVITY LIMITATIONS: bending, lifting, carry, locomotion,  cleaning, community activity, driving   PERSONAL FACTORS:  PMH:anx,OA,ovarian Ca,Dep,HTN are also affecting patient's functional outcome.   REHAB POTENTIAL: Good   CLINICAL DECISION MAKING: Stable/uncomplicated   EVALUATION COMPLEXITY: Low       GOALS: Short term PT Goals Target date: 05/11/2022 Pt will be I and compliant with HEP. Baseline:  Goal status: New Pt will decrease pain by  25% overall Baseline:9.5 Goal status: New   Long term PT goals Target date: 06/08/2022 Pt will improve knee AROM to [facility] to improve functional mobility Baseline: Goal status: New Pt will improve  hip/knee strength to at least 4/5 MMT to improve functional strength Baseline: Goal status: New Pt will improve FOTO to at least 59%predicted goal functional to show improved function Baseline: Goal status: New Pt will reduce pain by overall 50% overall with usual activity Baseline:9.5 Goal status: New Pt will be able to ambulate at least 300 ft LRAD Baseline: Goal status: New   PLAN: PT FREQUENCY: 1-2 times per week    PT DURATION: 6-8 weeks   PLANNED INTERVENTIONS (unless contraindicated): aquatic PT, Canalith repositioning, cryotherapy, Electrical stimulation, Iontophoresis with 4 mg/ml dexamethasome, Moist heat, traction, Ultrasound, gait training, Therapeutic exercise, balance training, neuromuscular re-education, patient/family education, prosthetic training, manual techniques, passive ROM, dry needling, taping, vasopnuematic device, vestibular, spinal manipulations, joint manipulations   PLAN FOR NEXT SESSION: quad strength and knee ROM as tolerated. Gait and balance as tolerated.    Debbe Odea, PT,DPT 04/26/2022, 9:48 AM

## 2022-04-30 ENCOUNTER — Ambulatory Visit (INDEPENDENT_AMBULATORY_CARE_PROVIDER_SITE_OTHER): Payer: No Typology Code available for payment source | Admitting: Physical Therapy

## 2022-04-30 ENCOUNTER — Encounter: Payer: Self-pay | Admitting: Physical Therapy

## 2022-04-30 DIAGNOSIS — M25562 Pain in left knee: Secondary | ICD-10-CM

## 2022-04-30 DIAGNOSIS — M6281 Muscle weakness (generalized): Secondary | ICD-10-CM

## 2022-04-30 DIAGNOSIS — G8929 Other chronic pain: Secondary | ICD-10-CM | POA: Diagnosis not present

## 2022-04-30 DIAGNOSIS — R600 Localized edema: Secondary | ICD-10-CM | POA: Diagnosis not present

## 2022-04-30 NOTE — Therapy (Signed)
OUTPATIENT PHYSICAL THERAPY TREATMENT NOTE   Patient Name: Kristen Villarreal MRN: [id number] DOB:[birth year], 66 y.o., female Today's Date: 04/30/2022  END OF SESSION:   PT End of Session - 04/30/22 0939     Visit Number 3    Number of Visits 12    Date for PT Re-Evaluation 06/08/22    Authorization Type devoted health    PT Start Time 0930    PT Stop Time 1010    PT Time Calculation (min) 40 min    Activity Tolerance Patient limited by pain    Behavior During Therapy [facility] for tasks assessed/performed              Past Medical History:  Diagnosis Date   Anxiety    Arthritis    left shoulder   Asthma    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   Complication of anesthesia    woke up during foot surgery   Depression    Hypercholesteremia    Hypertension    Pre-diabetes    Past Surgical History:  Procedure Laterality Date   ABDOMINAL HYSTERECTOMY     CARPAL TUNNEL RELEASE Left 08/10/2021   Procedure: LEFT CARPAL TUNNEL RELEASE;  Surgeon: Mcarthur Rossetti, MD;  Location: [place];  Service: Orthopedics;  Laterality: Left;   FOOT SURGERY Bilateral    Patient Active Problem List   Diagnosis Date Noted   Carpal tunnel syndrome, left upper limb 08/10/2021     THERAPY DIAG: Chronic pain of left knee  Muscle weakness (generalized)  Localized edema   PCP: Nolene Ebbs, MD   REFERRING PROVIDER: Pete Pelt, PA-C   REFERRING DIAG: [phone] (ICD-10-CM) - Primary osteoarthritis of left knee  Rationale for Evaluation and Treatment Rehabilitation   ONSET DATE: 12/06/21 fell   SUBJECTIVE:    SUBJECTIVE STATEMENT: Still having high levels of pain    PERTINENT HISTORY: PMH:anx,knee OA,ovarian Ca,Dep,HTN   PAIN:  Are you having pain? Yes: NPRS scale: 8/10 Pain location: left medial knee Pain description: weird pain, sharp and dull Aggravating factors: walking, rain Relieving factors: has tried heat and ice and alieve but no relief from  this.   PRECAUTIONS: None   WEIGHT BEARING RESTRICTIONS No   FALLS:  Has patient fallen in last 6 months? Yes. Number of falls 1, she is fearful of falling   OCCUPATION: retired   PLOF: Independent with basic ADLs   PATIENT GOALS : be able to walk without cane.      OBJECTIVE:    DIAGNOSTIC FINDINGS:  Knee MRI IMPRESSION: 1. Mild medial tibiofemoral arthritis with articular cartilage thinning and small marginal osteophytes. 2. Small suprapatellar joint effusion. 3. Cruciate and collateral ligaments are intact. Medial and lateral menisci are maintained. No Evidence of fracture or osteonecrosis. Muscles, skin and subcutaneous soft tissues are unremarkable.   PATIENT SURVEYS:  04/26/22: FOTO 47% functional, goal is 59%   COGNITION:           Overall cognitive status: Within functional limits for tasks assessed                          SENSATION: WFL   EDEMA:  Mild to moderate edema left medial knee   MUSCLE LENGTH: Tightness noted in left hamstrings and quads   POSTURE: slumped posture   PALPATION: Very tender to palpation left medial knee.   LOWER EXTREMITY ROM:   Active ROM/PROM Left eval Left 04/26/22  Hip  flexion      Hip extension      Hip abduction      Hip adduction      Hip internal rotation      Hip external rotation      Knee flexion 65/75 (empty end feel and muscle guarding due to pain) /90   Knee extension 8/5    Ankle dorsiflexion      Ankle plantarflexion      Ankle inversion      Ankle eversion       (Blank rows = not tested)   LOWER EXTREMITY MMT:   MMT Left eval  Hip flexion 2  Hip extension    Hip abduction 2  Hip adduction    Hip internal rotation    Hip external rotation    Knee flexion 2  Knee extension 2  Ankle dorsiflexion    Ankle plantarflexion    Ankle inversion    Ankle eversion     (Blank rows = not tested)   LOWER EXTREMITY SPECIAL TESTS:      FUNCTIONAL TESTS:  Timed up and go (TUG): 1:06 with SPC    GAIT:Eval: Distance walked: 50 feet Assistive device utilized: Single point cane Level of assistance: Modified independence/supervision Comments: very slow velocity, antalgic gait, gait instability       TODAY'S TREATMENT:  04/30/22 TherEx Recumbent bike L2 x 5 min Seated LAQ 2# 2x10 bil Seated Lt SLR x10 reps Sit to/from stand 2x5 reps Side stepping onto 6" step x 10 reps bil  04/26/22 -Nu step L5 X 6 min -Slantoboard stretch 3 X 30 sec -Heel and toe raises X 15 -Step ups onto 4 inch step  X10 leading with left leg -Sit to stands X 15, no UE support from 22.5 inch table -seated LAQ 2# 2X10 -Seated SLR X 10 on left -FOTO intake  -Manual therapy: PROM with overpressure into flexion for left knee in sitting     PATIENT EDUCATION: Education details: HEP, PT plan of care Person educated: Patient Education method: Explanation, Demonstration, Verbal cues, and Handouts Education comprehension: verbalized understanding and needs further education     HOME EXERCISE PROGRAM: Access Code: [email or web address] URL: [email or web address] Date: 04/13/2022 Prepared by: Elsie Ra   Exercises - Seated Knee Flexion Stretch  - 2 x daily - 6 x weekly - 1 sets - 10 reps - 5 sec hold - Seated Straight Leg Raise with Quad Contraction  - 2 x daily - 6 x weekly - 1 sets - 10 reps - Seated Long Arc Quad  - 2 x daily - 6 x weekly - 2 sets - 10 reps - alternating marching  - 2 x daily - 6 x weekly - 1-2 sets - 10 reps   ASSESSMENT:   CLINICAL IMPRESSION: Pt with continued high levels of pain making tolerance to strengthening exercises limited.  Will continue to benefit from PT to maximize function.   OBJECTIVE IMPAIRMENTS: decreased activity tolerance, difficulty walking, decreased balance, decreased endurance, decreased mobility, decreased ROM, decreased strength, impaired flexibility, impaired LE use, postural dysfunction, and pain.   ACTIVITY LIMITATIONS: bending, lifting, carry,  locomotion, cleaning, community activity, driving   PERSONAL FACTORS:  PMH:anx,OA,ovarian Ca,Dep,HTN are also affecting patient's functional outcome.   REHAB POTENTIAL: Good   CLINICAL DECISION MAKING: Stable/uncomplicated   EVALUATION COMPLEXITY: Low       GOALS: Short term PT Goals Target date: 05/11/2022 Pt will be I and compliant with HEP. Baseline:  Goal status: New Pt will decrease  pain by 25% overall Baseline:9.5 Goal status: New   Long term PT goals Target date: 06/08/2022 Pt will improve knee AROM to [facility] to improve functional mobility Baseline: Goal status: New Pt will improve  hip/knee strength to at least 4/5 MMT to improve functional strength Baseline: Goal status: New Pt will improve FOTO to at least 59%predicted goal functional to show improved function Baseline: Goal status: New Pt will reduce pain by overall 50% overall with usual activity Baseline:9.5 Goal status: New Pt will be able to ambulate at least 300 ft LRAD Baseline: Goal status: New   PLAN: PT FREQUENCY: 1-2 times per week    PT DURATION: 6-8 weeks   PLANNED INTERVENTIONS (unless contraindicated): aquatic PT, Canalith repositioning, cryotherapy, Electrical stimulation, Iontophoresis with 4 mg/ml dexamethasome, Moist heat, traction, Ultrasound, gait training, Therapeutic exercise, balance training, neuromuscular re-education, patient/family education, prosthetic training, manual techniques, passive ROM, dry needling, taping, vasopnuematic device, vestibular, spinal manipulations, joint manipulations   PLAN FOR NEXT SESSION: quad strength and knee ROM as tolerated. Gait and balance as tolerated.    Faustino Congress, PT,DPT 04/30/2022, 10:14 AM

## 2022-05-03 ENCOUNTER — Ambulatory Visit (INDEPENDENT_AMBULATORY_CARE_PROVIDER_SITE_OTHER): Payer: No Typology Code available for payment source | Admitting: Physical Therapy

## 2022-05-03 ENCOUNTER — Encounter: Payer: Self-pay | Admitting: Physical Therapy

## 2022-05-03 DIAGNOSIS — R600 Localized edema: Secondary | ICD-10-CM | POA: Diagnosis not present

## 2022-05-03 DIAGNOSIS — M6281 Muscle weakness (generalized): Secondary | ICD-10-CM | POA: Diagnosis not present

## 2022-05-03 DIAGNOSIS — G8929 Other chronic pain: Secondary | ICD-10-CM

## 2022-05-03 DIAGNOSIS — M25562 Pain in left knee: Secondary | ICD-10-CM

## 2022-05-03 NOTE — Therapy (Addendum)
OUTPATIENT PHYSICAL THERAPY TREATMENT NOTE/Discharge addendum PHYSICAL THERAPY DISCHARGE SUMMARY  Visits from Start of Care: 4  Current functional level related to goals / functional outcomes: See below   Remaining deficits: See below   Education / Equipment: HEP  Plan:  Patient goals were not met. Patient is being discharged due lack of progress and was referred back to MD Elsie Ra, PT, DPT 06/18/22 8:28 AM       Patient Name: Kristen Villarreal MRN: [id number] DOB:Sep 20, 1955, 66 y.o.,, female Today's Date: 68/11/2021  END OF SESSION:   PT End of Session - 05/03/22 1017     Visit Number 4    Number of Visits 12    Date for PT Re-Evaluation 06/08/22    Authorization Type [employer]    PT Start Time 1015    PT Stop Time 1053    PT Time Calculation (min) 38 min    Activity Tolerance Patient limited by pain    Behavior During Therapy WFL for tasks assessed/performed              Past Medical History:  Diagnosis Date   Anxiety    Arthritis    left shoulder   Asthma    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   Complication of anesthesia    woke up during foot surgery   Depression    Hypercholesteremia    Hypertension    Pre-diabetes    Past Surgical History:  Procedure Laterality Date   ABDOMINAL HYSTERECTOMY     CARPAL TUNNEL RELEASE Left 08/10/2021   Procedure: LEFT CARPAL TUNNEL RELEASE;  Surgeon: Mcarthur Rossetti, MD;  Location: [facility];  Service: Orthopedics;  Laterality: Left;   FOOT SURGERY Bilateral    Patient Active Problem List   Diagnosis Date Noted   Carpal tunnel syndrome, left upper limb 08/10/2021     THERAPY DIAG: Chronic pain of left knee  Muscle weakness (generalized)  Localized edema   PCP: Nolene Ebbs, MD   REFERRING PROVIDER: Pete Pelt, PA-C   REFERRING DIAG: [phone] (ICD-10-CM) - Primary osteoarthritis of left knee  Rationale for Evaluation and Treatment Rehabilitation   ONSET  DATE: 12/06/21 fell   SUBJECTIVE:    SUBJECTIVE STATEMENT: She reports 7/10 pain and that it is not really getting any better.   PERTINENT HISTORY: PMH:anx,knee OA,ovarian Ca,Dep,HTN   PAIN:  Are you having pain? Yes: NPRS scale: 7/10 Pain location: left medial knee Pain description: weird pain, sharp and dull Aggravating factors: walking, rain Relieving factors: has tried heat and ice and alieve but no relief from this.   PRECAUTIONS: None   WEIGHT BEARING RESTRICTIONS No   FALLS:  Has patient fallen in last 6 months? Yes. Number of falls 1, she is fearful of falling   OCCUPATION: retired   PLOF: Independent with basic ADLs   PATIENT GOALS : be able to walk without cane.      OBJECTIVE:    DIAGNOSTIC FINDINGS:  Knee MRI IMPRESSION: 1. Mild medial tibiofemoral arthritis with articular cartilage thinning and small marginal osteophytes. 2. Small suprapatellar joint effusion. 3. Cruciate and collateral ligaments are intact. Medial and lateral menisci are maintained. No Evidence of fracture or osteonecrosis. Muscles, skin and subcutaneous soft tissues are unremarkable.   PATIENT SURVEYS:  04/26/22: FOTO 47% functional, goal is 59%   COGNITION:           Overall cognitive status: Within functional limits for tasks assessed  SENSATION: WFL   EDEMA:  Mild to moderate edema left medial knee   MUSCLE LENGTH: Tightness noted in left hamstrings and quads   POSTURE: slumped posture   PALPATION: Very tender to palpation left medial knee.   LOWER EXTREMITY ROM:   Active ROM/PROM Left eval Left 04/26/22 Left 05/03/22  Hip flexion       Hip extension       Hip abduction       Hip adduction       Hip internal rotation       Hip external rotation       Knee flexion 65/75 (empty end feel and muscle guarding due to pain) /90  70/90  Knee extension 8/5     Ankle dorsiflexion       Ankle plantarflexion       Ankle inversion       Ankle  eversion        (Blank rows = not tested)   LOWER EXTREMITY MMT:   MMT Left eval Left 05/03/22  Hip flexion 2   Hip extension     Hip abduction 2   Hip adduction     Hip internal rotation     Hip external rotation     Knee flexion 2 3  Knee extension 2 3  Ankle dorsiflexion     Ankle plantarflexion     Ankle inversion     Ankle eversion      (Blank rows = not tested)   LOWER EXTREMITY SPECIAL TESTS:      FUNCTIONAL TESTS:  Timed up and go (TUG): 1:06 with SPC   GAIT:Eval: Distance walked: 50 feet Assistive device utilized: Single point cane Level of assistance: Modified independence/supervision Comments: very slow velocity, antalgic gait, gait instability       TODAY'S TREATMENT:  05/03/22 -Nu step L5 X 6 min -Slantboard stretch 3 X 30 sec -Heel and toe raises X 15 -Side stepping onto 4" step x 10 reps bil (had to decrease from 6 inch to 4 inch due to pain and one incidence of her knee almost giving away on her) -Leg press DL 31# 2X10, then left leg only 12# 2X10 -Sit to stands X 15, no UE support from 22.5 inch table -Seated SLR X 10 on left  Manual therapy: KT tape for anterior Left knee support  04/30/22 TherEx Recumbent bike L2 x 5 min Seated LAQ 2# 2x10 bil Seated Lt SLR x10 reps Sit to/from stand 2x5 reps Side stepping onto 6" step x 10 reps bil      PATIENT EDUCATION: Education details: HEP, PT plan of care Person educated: Patient Education method: Explanation, Demonstration, Verbal cues, and Handouts Education comprehension: verbalized understanding and needs further education     HOME EXERCISE PROGRAM: Access Code: [email or web address] URL: [email or web address] Date: 04/13/2022 Prepared by: Elsie Ra   Exercises - Seated Knee Flexion Stretch  - 2 x daily - 6 x weekly - 1 sets - 10 reps - 5 sec hold - Seated Straight Leg Raise with Quad Contraction  - 2 x daily - 6 x weekly - 1 sets - 10 reps - Seated Long Arc Quad  - 2 x daily - 6  x weekly - 2 sets - 10 reps - alternating marching  - 2 x daily - 6 x weekly - 1-2 sets - 10 reps   ASSESSMENT:   CLINICAL IMPRESSION: She is not able to progress with PT due to pain. I did try KT tape today  for anterior knee support to see if this helps. It so then she may benefit from knee brace. At this point I recommend to hold PT and we will refer her back to MD due to her continued high levels of pain that are not being improved with PT.  OBJECTIVE IMPAIRMENTS: decreased activity tolerance, difficulty walking, decreased balance, decreased endurance, decreased mobility, decreased ROM, decreased strength, impaired flexibility, impaired LE use, postural dysfunction, and pain.   ACTIVITY LIMITATIONS: bending, lifting, carry, locomotion, cleaning, community activity, driving   PERSONAL FACTORS:  PMH:anx,OA,ovarian Ca,Dep,HTN are also affecting patient's functional outcome.   REHAB POTENTIAL: Good   CLINICAL DECISION MAKING: Stable/uncomplicated   EVALUATION COMPLEXITY: Low       GOALS: Short term PT Goals Target date: 05/11/2022 Pt will be I and compliant with HEP. Baseline:  Goal status: New Pt will decrease pain by 25% overall Baseline:9.5 Goal status: New   Long term PT goals Target date: 06/08/2022 Pt will improve knee AROM to [facility] to improve functional mobility Baseline: Goal status: New Pt will improve  hip/knee strength to at least 4/5 MMT to improve functional strength Baseline: Goal status: New Pt will improve FOTO to at least 59%predicted goal functional to show improved function Baseline: Goal status: New Pt will reduce pain by overall 50% overall with usual activity Baseline:9.5 Goal status: New Pt will be able to ambulate at least 300 ft LRAD Baseline: Goal status: New   PLAN: PT FREQUENCY: 1-2 times per week    PT DURATION: 6-8 weeks   PLANNED INTERVENTIONS (unless contraindicated): aquatic PT, Canalith repositioning, cryotherapy, Electrical  stimulation, Iontophoresis with 4 mg/ml dexamethasome, Moist heat, traction, Ultrasound, gait training, Therapeutic exercise, balance training, neuromuscular re-education, patient/family education, prosthetic training, manual techniques, passive ROM, dry needling, taping, vasopnuematic device, vestibular, spinal manipulations, joint manipulations   PLAN FOR NEXT SESSION: Hold PT and refer back to MD for now.     Debbe Odea, PT,DPT 05/03/2022, 11:00 AM

## 2022-05-14 ENCOUNTER — Ambulatory Visit (INDEPENDENT_AMBULATORY_CARE_PROVIDER_SITE_OTHER): Payer: No Typology Code available for payment source | Admitting: Physician Assistant

## 2022-05-14 ENCOUNTER — Encounter: Payer: Self-pay | Admitting: Physician Assistant

## 2022-05-14 ENCOUNTER — Telehealth: Payer: No Typology Code available for payment source

## 2022-05-14 DIAGNOSIS — M1712 Unilateral primary osteoarthritis, left knee: Secondary | ICD-10-CM

## 2022-05-14 NOTE — Progress Notes (Signed)
HPI: Kristen Villarreal returns today for follow-up of her left knee.  Again MRI showed mild arthritic changes involving the medial compartment otherwise no acute meniscal tears or acute findings.  She is still having significant pain in the left knee.  She has been going to therapy and taken Tylenol arthritis.  She states that the knee is buckling on her.  She also notes cracking and popping in the knee no new injury.  The last physical therapy visit was October 5 and she has continued to do home exercise program.  Review of systems: See HPI otherwise negative  Physical exam: General well-developed well-nourished female who ambulates with a cane slight antalgic gait on the left. Psych: Alert and oriented x3 Bilateral hips good range of motion of both hips without pain. Left knee: Slight crepitus with passive range of motion.  Tenderness along medial joint line.  No instability.  No abnormal warmth erythema or effusion.  Impression: Left knee arthritis  Plan: Given patient's failure of conservative treatment which included physical therapy medications and time and her MRI which shows only mild arthritic changes involving the medial compartment recommend left knee supplemental injection.  We will try to gain approval for supplemental injection and have her back once this is available.  In the interim she will continue to work on quad strengthening.  Questions were encouraged and answered.

## 2022-05-14 NOTE — Telephone Encounter (Signed)
Please get auth for left knee gel injection-gil pt 

## 2022-05-15 NOTE — Telephone Encounter (Signed)
VOB submitted for Monovisc, left knee 

## 2022-06-15 ENCOUNTER — Telehealth: Payer: Self-pay | Admitting: Orthopaedic Surgery

## 2022-06-15 NOTE — Telephone Encounter (Signed)
Patient came by. She would like to know the status of the gel injections. Also would like to the price if the insurance will not cover. [phone]

## 2022-06-15 NOTE — Telephone Encounter (Signed)
Talked with patient concerning gel injection.  Advised patient that she will receive a call once approved by her insurance.  Patient voiced that she understands.

## 2022-06-19 ENCOUNTER — Telehealth: Payer: Self-pay | Admitting: Orthopaedic Surgery

## 2022-06-19 NOTE — Telephone Encounter (Signed)
Patient called stating she was upset cause she just got off the phone with [facility] her insurance company. She was told that they have not received anything from our office requesting auth for gel injection.  And that no has called [employer] either. Patient states she feels she is getting the run around from the office.   April just spoke with her on 06/15/22 regarding the process for the approval. She request a call back ASAP.

## 2022-06-20 NOTE — Telephone Encounter (Signed)
Called and left a VM advising patient that PA was faxed to [facility] last week.  I have refaxed the PA to [facility] for the 2nd time and will call to make they have received it.  Once, I do receive and approval from [facility].  I will call to schedule her for gel injection.  Refaxed to [facility] at [phone]

## 2022-06-26 ENCOUNTER — Telehealth: Payer: Self-pay | Admitting: Orthopaedic Surgery

## 2022-06-26 NOTE — Telephone Encounter (Signed)
Patient states that she received the letter for shot and wants to know if she can get scheduled.

## 2022-06-27 ENCOUNTER — Other Ambulatory Visit: Payer: Self-pay

## 2022-06-27 DIAGNOSIS — M1712 Unilateral primary osteoarthritis, left knee: Secondary | ICD-10-CM

## 2022-06-27 NOTE — Telephone Encounter (Signed)
Talked with pateint and appointment has been scheduled for gel injection.

## 2022-07-04 ENCOUNTER — Encounter: Payer: Self-pay | Admitting: Orthopaedic Surgery

## 2022-07-04 ENCOUNTER — Ambulatory Visit: Payer: No Typology Code available for payment source | Admitting: Orthopaedic Surgery

## 2022-07-04 DIAGNOSIS — M1712 Unilateral primary osteoarthritis, left knee: Secondary | ICD-10-CM | POA: Diagnosis not present

## 2022-07-04 MED ORDER — HYALURONAN 88 MG/4ML IX SOSY
88.0000 mg | PREFILLED_SYRINGE | INTRA_ARTICULAR | Status: AC | PRN
  Administered 2022-07-04: 88 mg via INTRA_ARTICULAR

## 2022-07-04 NOTE — Progress Notes (Signed)
   Procedure Note  Patient: Kristen Villarreal             Date of Birth: Apr 26, 1956           MRN: [id number]             Visit Date: 07/04/2022  Procedures: Visit Diagnoses:  1. Primary osteoarthritis of left knee     Large Joint Inj: L knee on 07/04/2022 3:45 PM Indications: diagnostic evaluation and pain Details: 22 G 1.5 in needle, superolateral approach  Arthrogram: No  Medications: 88 mg Hyaluronan 88 MG/4ML Outcome: tolerated well, no immediate complications Procedure, treatment alternatives, risks and benefits explained, specific risks discussed. Consent was given by the patient. Immediately prior to procedure a time out was called to verify the correct patient, procedure, equipment, support staff and site/side marked as required. Patient was prepped and draped in the usual sterile fashion.    The patient comes in today for scheduled hyaluronic acid injection with Monovisc in her left knee to treat the pain from osteoarthritis.  She is trying for other conservative treatment measures including steroid injection.  Her knee shows varus malalignment and significant medial joint line tenderness when examining the left knee today.  There is no significant effusion.  I did place Monovisc in her left knee today without difficulty.  Will see her back in 3 months to see how she is doing overall but no x-rays are needed.  She understands that our only other option would be considering a knee replacement if this fails.  Lot #[id number]

## 2022-08-31 ENCOUNTER — Other Ambulatory Visit: Payer: Self-pay | Admitting: Internal Medicine

## 2022-09-01 LAB — COMPLETE METABOLIC PANEL WITH GFR
AG Ratio: 1.1 (calc) (ref 1.0–2.5)
ALT: 19 U/L (ref 6–29)
AST: 21 U/L (ref 10–35)
Albumin: 4.1 g/dL (ref 3.6–5.1)
Alkaline phosphatase (APISO): 76 U/L (ref 37–153)
BUN/Creatinine Ratio: 17 (calc) (ref 6–22)
BUN: 25 mg/dL (ref 7–25)
CO2: 21 mmol/L (ref 20–32)
Calcium: 10.3 mg/dL (ref 8.6–10.4)
Chloride: 108 mmol/L (ref 98–110)
Creat: 1.43 mg/dL — ABNORMAL HIGH (ref 0.50–1.05)
Globulin: 3.6 g/dL (calc) (ref 1.9–3.7)
Glucose, Bld: 105 mg/dL — ABNORMAL HIGH (ref 65–99)
Potassium: 4.9 mmol/L (ref 3.5–5.3)
Sodium: 144 mmol/L (ref 135–146)
Total Bilirubin: 0.3 mg/dL (ref 0.2–1.2)
Total Protein: 7.7 g/dL (ref 6.1–8.1)
eGFR: 40 mL/min/{1.73_m2} — ABNORMAL LOW (ref >=60)

## 2022-09-01 LAB — TSH: TSH: 2.31 mIU/L (ref 0.40–4.50)

## 2022-09-01 LAB — CBC
HCT: 33.8 % — ABNORMAL LOW (ref 35.0–45.0)
Hemoglobin: 10.6 g/dL — ABNORMAL LOW (ref 11.7–15.5)
MCH: 22.5 pg — ABNORMAL LOW (ref 27.0–33.0)
MCHC: 31.4 g/dL — ABNORMAL LOW (ref 32.0–36.0)
MCV: 71.6 fL — ABNORMAL LOW (ref 80.0–100.0)
MPV: 10.5 fL (ref 7.5–12.5)
Platelets: 279 10*3/uL (ref 140–400)
RBC: 4.72 10*6/uL (ref 3.80–5.10)
RDW: 16.1 % — ABNORMAL HIGH (ref 11.0–15.0)
WBC: 6 10*3/uL (ref 3.8–10.8)

## 2022-09-01 LAB — LIPID PANEL
Cholesterol: 166 mg/dL (ref <200)
HDL: 47 mg/dL — ABNORMAL LOW (ref >=50)
LDL Cholesterol (Calc): 102 mg/dL (calc) — ABNORMAL HIGH
Non-HDL Cholesterol (Calc): 119 mg/dL (calc) (ref <130)
Total CHOL/HDL Ratio: 3.5 (calc) (ref <5.0)
Triglycerides: 83 mg/dL (ref <150)

## 2022-09-01 LAB — HEMOGLOBIN A1C W/OUT EAG: Hgb A1c MFr Bld: 6.8 % of total Hgb — ABNORMAL HIGH (ref <5.7)

## 2022-09-01 LAB — FOLATE: Folate: >24 ng/mL

## 2022-09-01 LAB — VITAMIN D 25 HYDROXY (VIT D DEFICIENCY, FRACTURES): Vit D, 25-Hydroxy: 92 ng/mL (ref 30–100)

## 2022-09-01 LAB — VITAMIN B12: Vitamin B-12: 1492 pg/mL — ABNORMAL HIGH (ref 200–1100)

## 2022-10-03 ENCOUNTER — Ambulatory Visit: Payer: No Typology Code available for payment source | Admitting: Orthopaedic Surgery

## 2022-10-04 ENCOUNTER — Encounter: Payer: Self-pay | Admitting: Radiology

## 2022-11-05 ENCOUNTER — Encounter: Payer: Self-pay | Admitting: Physician Assistant

## 2022-11-05 ENCOUNTER — Ambulatory Visit: Payer: No Typology Code available for payment source | Admitting: Physician Assistant

## 2022-11-05 DIAGNOSIS — M1712 Unilateral primary osteoarthritis, left knee: Secondary | ICD-10-CM

## 2022-11-05 DIAGNOSIS — G5601 Carpal tunnel syndrome, right upper limb: Secondary | ICD-10-CM | POA: Diagnosis not present

## 2022-11-05 MED ORDER — METHYLPREDNISOLONE ACETATE 40 MG/ML IJ SUSP
40.0000 mg | INTRAMUSCULAR | Status: AC | PRN
  Administered 2022-11-05: 40 mg via INTRA_ARTICULAR

## 2022-11-05 MED ORDER — LIDOCAINE HCL 1 % IJ SOLN
3.0000 mL | INTRAMUSCULAR | Status: AC | PRN
  Administered 2022-11-05: 3 mL

## 2022-11-05 NOTE — Progress Notes (Signed)
Office Visit Note   Patient: Kristen Villarreal           Date of Birth: April 06, 1956           MRN: [id number] Visit Date: 11/05/2022              Requested by: Fleet Contras, MD [address] [place],  [place] [address] PCP: Fleet Contras, MD   Assessment & Plan: Visit Diagnoses:  1. Carpal tunnel syndrome, right upper limb   2. Primary osteoarthritis of left knee     Plan: Given her severe carpal tunnel syndrome on the right recommend carpal tunnel release.  However she understands that she may not get the same results that she had on the left given the amount of time that is passed.  In regards to her left knee we will see how she does with the cortisone injection.  She knows to wait least 3 months between injections.  She will follow-up with [place] postop from the right carpal tunnel release.  Follow-Up Instructions: Return for post op.   Orders:  Orders Placed This Encounter  Procedures   Large Joint Inj   No orders of the defined types were placed in this encounter.     Procedures: Large Joint Inj: L knee on 11/05/2022 4:57 PM Indications: pain Details: 22 G 1.5 in needle, superolateral approach  Arthrogram: No  Medications: 3 mL lidocaine 1 %; 40 mg methylPREDNISolone acetate 40 MG/ML Aspirate: 7 mL yellow Outcome: tolerated well, no immediate complications Procedure, treatment alternatives, risks and benefits explained, specific risks discussed. Consent was given by the patient. Immediately prior to procedure a time out was called to verify the correct patient, procedure, equipment, support staff and site/side marked as required. Patient was prepped and draped in the usual sterile fashion.       Clinical Data: No additional findings.     HPI Kristen Villarreal comes in today for her left knee.  She has known osteoarthritis left knee underwent a Monovisc injection July 04, 2022.  States she did well until about February and that her knee pain came back.  She has  had no new injury.  She is asking for cortisone injection in the knee.  She is also asking about undergoing right carpal tunnel release.  She underwent left carpal tunnel release in the past.  EMG nerve conduction studies back 06/27/2021 showed severe bilateral median nerve entrapment.  She states now that the right hand is waking her up at night.  She is having difficulty writing her using her right hand to feed himself specially cutting food.  Review of Systems See HPI otherwise negative  Objective: Vital Signs: There were no vitals taken for this visit.  Physical Exam Constitutional:      Appearance: She is not ill-appearing.  Pulmonary:     Effort: Pulmonary effort is normal.  Neurological:     Mental Status: She is alert and oriented to person, place, and time.  Psychiatric:        Mood and Affect: Mood normal.     Ortho Exam Phalen's test is positive on the right.  Compression test over the right wrist median nerve positive.  Left hand normal sensation throughout.  Surgical incision from carpal tunnel release well-healed.  Left knee no abnormal warmth erythema.  Slight effusion.  Global tenderness.  Overall good range of motion of the knee. Specialty Comments:  No specialty comments available.  Imaging: No results found.   PMFS History: Patient Active  Problem List   Diagnosis Date Noted   Carpal tunnel syndrome, left upper limb 08/10/2021   Past Medical History:  Diagnosis Date   Anxiety    Arthritis    left shoulder   Asthma    Cancer    stage II ovarian   Complication of anesthesia    woke up during foot surgery   Depression    Hypercholesteremia    Hypertension    Pre-diabetes     Family History  Problem Relation Age of Onset   Breast cancer Neg Hx     Past Surgical History:  Procedure Laterality Date   ABDOMINAL HYSTERECTOMY     CARPAL TUNNEL RELEASE Left 08/10/2021   Procedure: LEFT CARPAL TUNNEL RELEASE;  Surgeon: Kristen Hitch, MD;   Location: [facility];  Service: Orthopedics;  Laterality: Left;   FOOT SURGERY Bilateral    Social History   Occupational History   Not on file  Tobacco Use   Smoking status: Never   Smokeless tobacco: Not on file  Substance and Sexual Activity   Alcohol use: Yes    Comment: rarely   Drug use: Never   Sexual activity: Not on file

## 2022-11-16 ENCOUNTER — Encounter (HOSPITAL_BASED_OUTPATIENT_CLINIC_OR_DEPARTMENT_OTHER): Payer: Self-pay | Admitting: Orthopaedic Surgery

## 2022-11-16 NOTE — Progress Notes (Signed)
Spoke w/ via phone for pre-op interview--- pt Lab needs dos----   [employer], ekg           Lab results------ no COVID test -----patient states asymptomatic no test needed Arrive at ------- 0815 on 11-22-2022 NPO after MN NO Solid Food.  Clear liquids from MN until--- 0715 Med rec completed Medications to take morning of surgery ----- eye drop as usual Diabetic medication ----- n/a Patient instructed no nail polish to be worn day of surgery Patient instructed to bring photo id and insurance card day of surgery Patient aware to have Driver (ride ) / caregiver    for 24 hours after surgery -- husband, Kristen Villarreal Patient Special Instructions ----- asked to bring rescue inhaler dos Pre-Op special Instructions ----- case just added on, pending orders Patient verbalized understanding of instructions that were given at this phone interview. Patient denies shortness of breath, chest pain, fever, cough at this phone interview.

## 2022-11-20 ENCOUNTER — Other Ambulatory Visit: Payer: Self-pay | Admitting: Physician Assistant

## 2022-11-22 DIAGNOSIS — Z01818 Encounter for other preprocedural examination: Secondary | ICD-10-CM

## 2022-11-26 NOTE — Progress Notes (Signed)
Spoke with pt via phone, pt stated she has all the instructions for 11-29-2022 surgery  that was rescheduled (surgery time is the same).

## 2022-11-28 DIAGNOSIS — G5601 Carpal tunnel syndrome, right upper limb: Secondary | ICD-10-CM | POA: Insufficient documentation

## 2022-11-28 NOTE — H&P (Signed)
Kristen Villarreal is an 67 y.o. female.   Chief Complaint: Right hand numbness and tingling HPI: The patient is well-known to me.  She has bilateral hand numbness and tingling that is been going on for a long period of time.  She actually had EMG/nerve conduction velocity studies performed of her bilateral upper extremities in November 2022.  The study showed severe bilateral median nerve entrapment at the wrist.  At this point she is presenting for a right open carpal tunnel release given her continued symptoms.  We have recommended this for some time for her as well.  Past Medical History:  Diagnosis Date   Anxiety    Arthritis    left shoulder   Asthma, mild    followed by pcp   Carpal tunnel syndrome, right    Complication of anesthesia    woke up during foot surgery   Depression    History of cancer chemotherapy 2002   per pt 3 weeks chemo post hysterecotmy   History of DVT of lower extremity 2010   per pt RLL  completed blood thinnner   History of ovarian cancer 2002   11-16-2022  s/p  TAH with BSO  per pt  stage II ovarian, post surgery had 3 wks of chemo , no radiation and no recurrence   Hyperlipidemia    Hypertension    IDA (iron deficiency anemia)    Lupus erythematosus    11-16-2022  per pt currently not being followed by anyone, dx many yrs ago, involves joints, stated last flare-up few yrs ago   OA (osteoarthritis) of knee    left   Pre-diabetes    Wears dentures    full upper and lower partial   Wears glasses     Past Surgical History:  Procedure Laterality Date   CARPAL TUNNEL RELEASE Left 08/10/2021   Procedure: LEFT CARPAL TUNNEL RELEASE;  Surgeon: Kathryne Hitch, MD;  Location: [facility];  Service: Orthopedics;  Laterality: Left;   FOOT SURGERY Bilateral 1986   bunionectomy both feet and left 5th toe bone removed   TOTAL ABDOMINAL HYSTERECTOMY W/ BILATERAL SALPINGOOPHORECTOMY  2002    Family History  Problem Relation Age of Onset    Breast cancer Neg Hx    Social History:  reports that she has never smoked. She has never used smokeless tobacco. She reports current alcohol use. She reports that she does not use drugs.  Allergies:  Allergies  Allergen Reactions   Strawberry Extract Anaphylaxis   Peach Flavor Swelling   Tomato Hives    No medications prior to admission.    No results found for this or any previous visit (from the past 48 hour(s)). No results found.  Review of Systems  Height 5' (1.524 m), weight 94 kg. Physical Exam Vitals reviewed.  Constitutional:      Appearance: Normal appearance.  HENT:     Head: Normocephalic and atraumatic.  Eyes:     Extraocular Movements: Extraocular movements intact.     Pupils: Pupils are equal, round, and reactive to light.  Cardiovascular:     Rate and Rhythm: Normal rate.  Pulmonary:     Effort: Pulmonary effort is normal.  Abdominal:     Palpations: Abdomen is soft.  Musculoskeletal:     Right hand: Decreased strength. Decreased sensation of the median distribution.     Cervical back: Normal range of motion and neck supple.  Neurological:     Mental Status: She is alert and  oriented to person, place, and time.  Psychiatric:        Behavior: Behavior normal.      Assessment/Plan Severe carpal tunnel syndrome right upper extremity  The plan is to proceed to surgery as an outpatient for a right open carpal tunnel release.  The risks and benefits of the surgery have been discussed in detail as well as a discussion of the anatomy of the wrist.  Discussion has been had in terms of what to expect from an intraoperative and postoperative standpoint.  Kathryne Hitch, MD 11/28/2022, 8:13 PM

## 2022-11-29 ENCOUNTER — Ambulatory Visit (HOSPITAL_BASED_OUTPATIENT_CLINIC_OR_DEPARTMENT_OTHER): Payer: No Typology Code available for payment source | Admitting: Anesthesiology

## 2022-11-29 ENCOUNTER — Other Ambulatory Visit: Payer: Self-pay

## 2022-11-29 ENCOUNTER — Encounter (HOSPITAL_BASED_OUTPATIENT_CLINIC_OR_DEPARTMENT_OTHER)
Admission: RE | Disposition: A | Discharge status: Home / Self Care | Payer: Self-pay | Source: Home / Self Care | Attending: Orthopaedic Surgery

## 2022-11-29 ENCOUNTER — Ambulatory Visit (HOSPITAL_BASED_OUTPATIENT_CLINIC_OR_DEPARTMENT_OTHER)
Admission: RE | Admit: 2022-11-29 | Discharge: 2022-11-29 | Disposition: A | Discharge status: Home / Self Care | Payer: No Typology Code available for payment source | Attending: Orthopaedic Surgery | Admitting: Orthopaedic Surgery

## 2022-11-29 ENCOUNTER — Encounter (HOSPITAL_BASED_OUTPATIENT_CLINIC_OR_DEPARTMENT_OTHER): Payer: Self-pay | Admitting: Orthopaedic Surgery

## 2022-11-29 DIAGNOSIS — J45909 Unspecified asthma, uncomplicated: Secondary | ICD-10-CM | POA: Diagnosis not present

## 2022-11-29 DIAGNOSIS — G5601 Carpal tunnel syndrome, right upper limb: Secondary | ICD-10-CM

## 2022-11-29 DIAGNOSIS — Z01818 Encounter for other preprocedural examination: Secondary | ICD-10-CM

## 2022-11-29 DIAGNOSIS — I10 Essential (primary) hypertension: Secondary | ICD-10-CM

## 2022-11-29 DIAGNOSIS — F418 Other specified anxiety disorders: Secondary | ICD-10-CM | POA: Diagnosis not present

## 2022-11-29 DIAGNOSIS — G5603 Carpal tunnel syndrome, bilateral upper limbs: Secondary | ICD-10-CM | POA: Insufficient documentation

## 2022-11-29 HISTORY — DX: Presence of spectacles and contact lenses: Z97.3

## 2022-11-29 HISTORY — DX: Osteoarthritis of knee, unspecified: M17.9

## 2022-11-29 HISTORY — DX: Unspecified asthma, uncomplicated: J45.909

## 2022-11-29 HISTORY — DX: Carpal tunnel syndrome, right upper limb: G56.01

## 2022-11-29 HISTORY — DX: Presence of dental prosthetic device (complete) (partial): Z97.2

## 2022-11-29 HISTORY — DX: Discoid lupus erythematosus: L93.0

## 2022-11-29 HISTORY — DX: Hyperlipidemia, unspecified: E78.5

## 2022-11-29 HISTORY — PX: CARPAL TUNNEL RELEASE: SHX101

## 2022-11-29 HISTORY — DX: Iron deficiency anemia, unspecified: D50.9

## 2022-11-29 LAB — POCT I-STAT, CHEM 8
BUN: 22 mg/dL (ref 8–23)
Calcium, Ion: 1.33 mmol/L (ref 1.15–1.40)
Chloride: 105 mmol/L (ref 98–111)
Creatinine, Ser: 1.7 mg/dL — ABNORMAL HIGH (ref 0.44–1.00)
Glucose, Bld: 100 mg/dL — ABNORMAL HIGH (ref 70–99)
HCT: 34 % — ABNORMAL LOW (ref 36.0–46.0)
Hemoglobin: 11.6 g/dL — ABNORMAL LOW (ref 12.0–15.0)
Potassium: 4.1 mmol/L (ref 3.5–5.1)
Sodium: 142 mmol/L (ref 135–145)
TCO2: 25 mmol/L (ref 22–32)

## 2022-11-29 SURGERY — CARPAL TUNNEL RELEASE
Anesthesia: Monitor Anesthesia Care | Site: Arm Lower | Laterality: Right

## 2022-11-29 MED ORDER — PROPOFOL 10 MG/ML IV BOLUS
INTRAVENOUS | Status: DC | PRN
  Administered 2022-11-29: 20 mg via INTRAVENOUS

## 2022-11-29 MED ORDER — ACETAMINOPHEN 10 MG/ML IV SOLN: 1000.0000 mg | Freq: Once | INTRAVENOUS | Status: DC | PRN

## 2022-11-29 MED ORDER — OXYCODONE HCL 5 MG PO TABS: 5.0000 mg | ORAL_TABLET | Freq: Once | ORAL | Status: DC | PRN

## 2022-11-29 MED ORDER — FENTANYL CITRATE (PF) 100 MCG/2ML IJ SOLN
INTRAMUSCULAR | Status: AC
  Filled 2022-11-29: qty 2

## 2022-11-29 MED ORDER — PROPOFOL 500 MG/50ML IV EMUL
INTRAVENOUS | Status: AC
  Filled 2022-11-29: qty 50

## 2022-11-29 MED ORDER — HYDROCODONE-ACETAMINOPHEN 5-325 MG PO TABS: 1.0000 | ORAL_TABLET | Freq: Four times a day (QID) | ORAL | 0 refills | Status: AC | PRN

## 2022-11-29 MED ORDER — ONDANSETRON HCL 4 MG/2ML IJ SOLN
INTRAMUSCULAR | Status: DC | PRN
  Administered 2022-11-29: 4 mg via INTRAVENOUS

## 2022-11-29 MED ORDER — BUPIVACAINE HCL (PF) 0.25 % IJ SOLN
INTRAMUSCULAR | Status: DC | PRN
  Administered 2022-11-29: 10 mL

## 2022-11-29 MED ORDER — ACETAMINOPHEN 500 MG PO TABS
ORAL_TABLET | ORAL | Status: AC
  Filled 2022-11-29: qty 2

## 2022-11-29 MED ORDER — 0.9 % SODIUM CHLORIDE (POUR BTL) OPTIME
TOPICAL | Status: DC | PRN
  Administered 2022-11-29: 500 mL

## 2022-11-29 MED ORDER — MIDAZOLAM HCL 5 MG/5ML IJ SOLN
INTRAMUSCULAR | Status: DC | PRN
  Administered 2022-11-29: 2 mg via INTRAVENOUS

## 2022-11-29 MED ORDER — AMISULPRIDE (ANTIEMETIC) 5 MG/2ML IV SOLN: 10.0000 mg | Freq: Once | INTRAVENOUS | Status: DC | PRN

## 2022-11-29 MED ORDER — ACETAMINOPHEN 500 MG PO TABS
1000.0000 mg | ORAL_TABLET | Freq: Four times a day (QID) | ORAL | Status: DC | PRN
  Administered 2022-11-29: 1000 mg via ORAL

## 2022-11-29 MED ORDER — ACETAMINOPHEN 325 MG PO TABS: 325.0000 mg | ORAL_TABLET | ORAL | Status: DC | PRN

## 2022-11-29 MED ORDER — PHENYLEPHRINE 80 MCG/ML (10ML) SYRINGE FOR IV PUSH (FOR BLOOD PRESSURE SUPPORT)
PREFILLED_SYRINGE | INTRAVENOUS | Status: AC
  Filled 2022-11-29: qty 10

## 2022-11-29 MED ORDER — LACTATED RINGERS IV SOLN: INTRAVENOUS | Status: DC

## 2022-11-29 MED ORDER — MIDAZOLAM HCL 2 MG/2ML IJ SOLN
INTRAMUSCULAR | Status: AC
  Filled 2022-11-29: qty 2

## 2022-11-29 MED ORDER — CEFAZOLIN SODIUM-DEXTROSE 2-4 GM/100ML-% IV SOLN
INTRAVENOUS | Status: AC
  Filled 2022-11-29: qty 100

## 2022-11-29 MED ORDER — CEFAZOLIN SODIUM-DEXTROSE 2-4 GM/100ML-% IV SOLN
2.0000 g | INTRAVENOUS | Status: AC
  Administered 2022-11-29: 2 g via INTRAVENOUS

## 2022-11-29 MED ORDER — ACETAMINOPHEN 160 MG/5ML PO SOLN: 325.0000 mg | ORAL | Status: DC | PRN

## 2022-11-29 MED ORDER — OXYCODONE HCL 5 MG/5ML PO SOLN: 5.0000 mg | Freq: Once | ORAL | Status: DC | PRN

## 2022-11-29 MED ORDER — PROPOFOL 500 MG/50ML IV EMUL
INTRAVENOUS | Status: DC | PRN
  Administered 2022-11-29: 200 ug/kg/min via INTRAVENOUS

## 2022-11-29 MED ORDER — PHENYLEPHRINE 80 MCG/ML (10ML) SYRINGE FOR IV PUSH (FOR BLOOD PRESSURE SUPPORT)
PREFILLED_SYRINGE | INTRAVENOUS | Status: DC | PRN
  Administered 2022-11-29: 80 ug via INTRAVENOUS
  Administered 2022-11-29: 160 ug via INTRAVENOUS

## 2022-11-29 MED ORDER — FENTANYL CITRATE (PF) 100 MCG/2ML IJ SOLN: 25.0000 ug | INTRAMUSCULAR | Status: DC | PRN

## 2022-11-29 MED ORDER — FENTANYL CITRATE (PF) 100 MCG/2ML IJ SOLN
INTRAMUSCULAR | Status: DC | PRN
  Administered 2022-11-29 (×2): 50 ug via INTRAVENOUS

## 2022-11-29 SURGICAL SUPPLY — 50 items
BLADE SURG 15 STRL LF DISP TIS (BLADE) ×4 IMPLANT
BLADE SURG 15 STRL SS (BLADE) ×1
BNDG CMPR 5X4 KNIT ELC UNQ LF (GAUZE/BANDAGES/DRESSINGS) ×1
BNDG CMPR 9X4 STRL LF SNTH (GAUZE/BANDAGES/DRESSINGS)
BNDG ELASTIC 3X5.8 VLCR STR LF (GAUZE/BANDAGES/DRESSINGS) ×2 IMPLANT
BNDG ELASTIC 4INX 5YD STR LF (GAUZE/BANDAGES/DRESSINGS) IMPLANT
BNDG ESMARK 4X9 LF (GAUZE/BANDAGES/DRESSINGS) IMPLANT
CORD BIPOLAR FORCEPS 12FT (ELECTRODE) ×2 IMPLANT
COVER BACK TABLE 60X90IN (DRAPES) ×2 IMPLANT
COVER MAYO STAND STRL (DRAPES) ×2 IMPLANT
CUFF TOURN SGL QUICK 18X4 (TOURNIQUET CUFF) IMPLANT
CUFF TOURN SGL QUICK 24 (TOURNIQUET CUFF)
CUFF TRNQT CYL 24X4X16.5-23 (TOURNIQUET CUFF) IMPLANT
DRAPE EXTREMITY T 121X128X90 (DISPOSABLE) ×2 IMPLANT
DRAPE SURG 17X23 STRL (DRAPES) ×2 IMPLANT
DURAPREP 26ML APPLICATOR (WOUND CARE) ×2 IMPLANT
GAUZE 4X4 16PLY ~~LOC~~+RFID DBL (SPONGE) ×2 IMPLANT
GAUZE SPONGE 4X4 12PLY STRL (GAUZE/BANDAGES/DRESSINGS) ×2 IMPLANT
GAUZE SPONGE 4X4 12PLY STRL LF (GAUZE/BANDAGES/DRESSINGS) IMPLANT
GAUZE XEROFORM 1X8 LF (GAUZE/BANDAGES/DRESSINGS) ×2 IMPLANT
GLOVE BIO SURGEON STRL SZ7 (GLOVE) IMPLANT
GLOVE BIO SURGEON STRL SZ7.5 (GLOVE) ×4 IMPLANT
GLOVE BIOGEL PI IND STRL 7.0 (GLOVE) IMPLANT
GLOVE BIOGEL PI IND STRL 8 (GLOVE) ×4 IMPLANT
GOWN STRL REUS W/ TWL LRG LVL3 (GOWN DISPOSABLE) ×2 IMPLANT
GOWN STRL REUS W/TWL LRG LVL3 (GOWN DISPOSABLE) ×1
GOWN STRL REUS W/TWL XL LVL3 (GOWN DISPOSABLE) ×4 IMPLANT
KIT TURNOVER CYSTO (KITS) ×2 IMPLANT
KNIFE CARPAL TUNNEL (BLADE) IMPLANT
LOOP VASCLR MAXI BLUE 18IN ST (MISCELLANEOUS) IMPLANT
LOOP VASCULAR MAXI 18 BLUE (MISCELLANEOUS)
LOOPS VASCLR MAXI BLUE 18IN ST (MISCELLANEOUS) IMPLANT
NDL HYPO 25X1 1.5 SAFETY (NEEDLE) IMPLANT
NEEDLE HYPO 25X1 1.5 SAFETY (NEEDLE) ×1 IMPLANT
NS IRRIG 1000ML POUR BTL (IV SOLUTION) ×2 IMPLANT
NS IRRIG 500ML POUR BTL (IV SOLUTION) IMPLANT
PACK BASIN DAY SURGERY FS (CUSTOM PROCEDURE TRAY) ×2 IMPLANT
PAD CAST 3X4 CTTN HI CHSV (CAST SUPPLIES) ×2 IMPLANT
PAD CAST 4YDX4 CTTN HI CHSV (CAST SUPPLIES) IMPLANT
PADDING CAST ABS COTTON 4X4 ST (CAST SUPPLIES) ×2 IMPLANT
PADDING CAST COTTON 3X4 STRL (CAST SUPPLIES)
PADDING CAST COTTON 4X4 STRL (CAST SUPPLIES) ×1
SLEEVE SCD COMPRESS KNEE MED (STOCKING) ×2 IMPLANT
STOCKINETTE 4X48 STRL (DRAPES) ×2 IMPLANT
SUT ETHILON 3 0 PS 1 (SUTURE) ×2 IMPLANT
SYR BULB EAR ULCER 3OZ GRN STR (SYRINGE) ×2 IMPLANT
SYR CONTROL 10ML LL (SYRINGE) IMPLANT
TOWEL OR 17X24 6PK STRL BLUE (TOWEL DISPOSABLE) ×2 IMPLANT
UNDERPAD 30X36 HEAVY ABSORB (UNDERPADS AND DIAPERS) ×2 IMPLANT
VASCULAR TIE MAXI BLUE 18IN ST (MISCELLANEOUS)

## 2022-11-29 NOTE — Anesthesia Postprocedure Evaluation (Signed)
Anesthesia Post Note  Patient: Kristen Villarreal  Procedure(s) Performed: RIGHT CARPAL TUNNEL RELEASE (Right: Arm Lower)     Patient location during evaluation: PACU Anesthesia Type: MAC Level of consciousness: awake and alert Pain management: pain level controlled Vital Signs Assessment: post-procedure vital signs reviewed and stable Respiratory status: spontaneous breathing, nonlabored ventilation, respiratory function stable and patient connected to nasal cannula oxygen Cardiovascular status: stable and blood pressure returned to baseline Postop Assessment: no apparent nausea or vomiting Anesthetic complications: no  No notable events documented.  Last Vitals:  Vitals:   11/29/22 1100 11/29/22 1133  BP: 115/60 103/64  Pulse: 66 (!) 49  Resp: 20 16  Temp:  36.8 C  SpO2: 95% 95%    Last Pain:  Vitals:   11/29/22 1133  TempSrc:   PainSc: 0-No pain                 Shelton Silvas

## 2022-11-29 NOTE — Anesthesia Procedure Notes (Signed)
Procedure Name: MAC Date/Time: 11/29/2022 10:06 AM  Performed by: Bishop Limbo, CRNAPre-anesthesia Checklist: Patient identified, Emergency Drugs available, Suction available and Patient being monitored Patient Re-evaluated:Patient Re-evaluated prior to induction Oxygen Delivery Method: Simple face mask Placement Confirmation: positive ETCO2 Dental Injury: Teeth and Oropharynx as per pre-operative assessment

## 2022-11-29 NOTE — Transfer of Care (Signed)
Immediate Anesthesia Transfer of Care Note  Patient: TANISE RUSSMAN  Procedure(s) Performed: RIGHT CARPAL TUNNEL RELEASE (Right: Arm Lower)  Patient Location: PACU  Anesthesia Type:MAC  Level of Consciousness: awake, alert , oriented, and patient cooperative  Airway & Oxygen Therapy: Patient Spontanous Breathing  Post-op Assessment: Report given to RN and Post -op Vital signs reviewed and stable  Post vital signs: Reviewed and stable  Last Vitals:  Vitals Value Taken Time  BP 106/64 11/29/22 1037  Temp    Pulse 70 11/29/22 1039  Resp 14 11/29/22 1039  SpO2 96 % 11/29/22 1039  Vitals shown include unvalidated device data.  Last Pain:  Vitals:   11/29/22 0900  TempSrc: Oral  PainSc: 0-No pain      Patients Stated Pain Goal: 4 (11/29/22 0900)  Complications: No notable events documented.

## 2022-11-29 NOTE — Discharge Instructions (Signed)
     No acetaminophen/Tylenol until after 3:05 pm today if needed.     Post Anesthesia Home Care Instructions  Activity: Get plenty of rest for the remainder of the day. A responsible individual must stay with you for 24 hours following the procedure.  For the next 24 hours, DO NOT: -Drive a car -Advertising copywriter -Drink alcoholic beverages -Take any medication unless instructed by your physician -Make any legal decisions or sign important papers.  Meals: Start with liquid foods such as gelatin or soup. Progress to regular foods as tolerated. Avoid greasy, spicy, heavy foods. If nausea and/or vomiting occur, drink only clear liquids until the nausea and/or vomiting subsides. Call your physician if vomiting continues.  Special Instructions/Symptoms: Your throat may feel dry or sore from the anesthesia or the breathing tube placed in your throat during surgery. If this causes discomfort, gargle with warm salt water. The discomfort should disappear within 24 hours.

## 2022-11-29 NOTE — Interval H&P Note (Signed)
History and Physical Interval Note: The patient understands that she is here today for a right open carpal tunnel release to treat her severe right carpal tunnel syndrome.  There has been no acute or interval change in her medical status.  See H&P.  The risks and benefits of surgery have been discussed in detail and informed consent is obtained.  The right operative hand has been marked.  11/29/2022 9:45 AM  Harrie Jeans Bryon Lions  has presented today for surgery, with the diagnosis of right carpal tunnel syndrome.  The various methods of treatment have been discussed with the patient and family. After consideration of risks, benefits and other options for treatment, the patient has consented to  Procedure(s): RIGHT CARPAL TUNNEL RELEASE (Right) as a surgical intervention.  The patient's history has been reviewed, patient examined, no change in status, stable for surgery.  I have reviewed the patient's chart and labs.  Questions were answered to the patient's satisfaction.     Kathryne Hitch

## 2022-11-29 NOTE — Anesthesia Preprocedure Evaluation (Addendum)
Anesthesia Evaluation  Patient identified by MRN, date of birth, ID band Patient awake    Reviewed: Allergy & Precautions, NPO status , Patient's Chart, lab work & pertinent test results  Airway Mallampati: II  TM Distance: >3 FB Neck ROM: Full    Dental  (+) Dental Advisory Given, Edentulous Upper   Pulmonary asthma    breath sounds clear to auscultation       Cardiovascular hypertension, Pt. on medications  Rhythm:Regular Rate:Normal     Neuro/Psych  PSYCHIATRIC DISORDERS Anxiety Depression     Neuromuscular disease    GI/Hepatic negative GI ROS, Neg liver ROS,,,  Endo/Other  negative endocrine ROS    Renal/GU negative Renal ROS     Musculoskeletal  (+) Arthritis ,    Abdominal   Peds  Hematology   Anesthesia Other Findings   Reproductive/Obstetrics                             Anesthesia Physical Anesthesia Plan  ASA: 2  Anesthesia Plan: General   Post-op Pain Management: Tylenol PO (pre-op)*   Induction: Intravenous  PONV Risk Score and Plan: 4 or greater and Ondansetron, Dexamethasone, Midazolam and Scopolamine patch - Pre-op  Airway Management Planned: LMA  Additional Equipment: None  Intra-op Plan:   Post-operative Plan: Extubation in OR  Informed Consent: I have reviewed the patients History and Physical, chart, labs and discussed the procedure including the risks, benefits and alternatives for the proposed anesthesia with the patient or authorized representative who has indicated his/her understanding and acceptance.     Dental advisory given  Plan Discussed with: CRNA  Anesthesia Plan Comments:        Anesthesia Quick Evaluation

## 2022-11-29 NOTE — Op Note (Signed)
Operative Note  Date of operation: 11/29/2022 Preoperative diagnosis: Severe right carpal tunnel syndrome Postoperative diagnosis: Same  Procedure: Right open carpal tunnel release  Surgeon: Vanita Panda. Magnus Ivan, MD Assistant: Rexene Edison, PA-C  Anesthesia: #1 mask ventilation and IV sedation, #2 local with quarter percent plain Marcaine Tourniquet time: Less than 10 minutes EBL: Minimal Complications: None  Indications: The patient is a 67 year old female with severe carpal tunnel syndrome of the right upper extremity verified by clinical exam as well as EMG/nerve conduction velocity studies.  At this point she wishes to proceed with an open carpal tunnel release given her continued symptoms and the findings of the nerve conduction studies.  We had a long and thorough discussion about the risk and benefits of the surgery and she does wish to proceed.  Procedure description: After informed consent was obtained appropriate right hand was marked the patient was brought to the operating room and kept on the stretcher with her right arm on an arm table.  A nonsterile tourniquet was placed around her upper right arm and her right hand and wrist and forearm were prepped and draped with DuraPrep and sterile drapes.  A timeout was called and she was then advised correct patient the correct right hand.  Mass ventilation and IV sedation was obtained and a Esmarch was used to wrap out the hand and the tourniquet was inflated to 300 mm of pressure.  We then anesthetized the palm over the transverse carpal ligament with core percent plain Marcaine.  We then made incision in the palm of the transverse carpal ligament and dissected from the distal edge of the proximal edge exposing the transverse carpal ligament and then slowly and meticulously dividing it from distal to proximal while protecting the median nerve.  We then explored the median nerve and found it in his motor branch to be intact.  The soft  tissue was then irrigated with normal saline solution.  The skin was reapproximated with interrupted nylon suture.  Xeroform well-padded sterile dressing was applied.  The tourniquet was let down and her fingers pinked nicely.  She was taken recovery room in stable addition.  Postoperatively she will be discharged from the recovery room to home.  Will give her wound care instructions and follow-up instructions.

## 2022-11-30 ENCOUNTER — Encounter (HOSPITAL_BASED_OUTPATIENT_CLINIC_OR_DEPARTMENT_OTHER): Payer: Self-pay | Admitting: Orthopaedic Surgery

## 2022-12-06 ENCOUNTER — Encounter: Payer: No Typology Code available for payment source | Admitting: Physician Assistant

## 2022-12-13 ENCOUNTER — Ambulatory Visit: Payer: No Typology Code available for payment source | Admitting: Physician Assistant

## 2022-12-13 ENCOUNTER — Encounter: Payer: Self-pay | Admitting: Physician Assistant

## 2022-12-13 DIAGNOSIS — Z9889 Other specified postprocedural states: Secondary | ICD-10-CM

## 2022-12-13 NOTE — Progress Notes (Signed)
HPI: Kristen Villarreal returns today 2 weeks status post right carpal tunnel release.  She is overall doing well.  She is having no pain in the hand.  She has had no drainage.  No complaints.  Physical exam: Right hand full motor full sensation throughout.  Surgical incision is healing there is no signs of dehiscence.  No drainage.  No signs of infection.  Impression: Status post right carpal tunnel release 11/29/2022. Plan: Will have her back in a week and at that time remove her sutures.  She will keep the hand clean and dry washing it for hygiene purposes only.  Questions were encouraged and answered at length.

## 2022-12-20 ENCOUNTER — Encounter: Payer: Self-pay | Admitting: Physician Assistant

## 2022-12-20 ENCOUNTER — Ambulatory Visit: Payer: No Typology Code available for payment source | Admitting: Physician Assistant

## 2022-12-20 DIAGNOSIS — Z9889 Other specified postprocedural states: Secondary | ICD-10-CM

## 2022-12-20 NOTE — Progress Notes (Signed)
HPI: Kristen Villarreal returns today status post right carpal tunnel release 11/29/2022.  She is here for suture removal.  She is overall doing well.  She states the numbness tingling in her hand is gone.  She notes that she has some itching about the incision particularly at night.  Impression: Status post carpal tunnel release for right hand  Plan: Sutures were harvested Steri-Strips applied.  She will work on scar tissue mobilization.  Will see her back in 1 month.  She is reminded no submerging of the hand but she can shower and get the hand wet with a hand for hygiene purposes.  Questions were encouraged and answered

## 2023-01-14 ENCOUNTER — Other Ambulatory Visit: Payer: Self-pay | Admitting: Internal Medicine

## 2023-01-14 DIAGNOSIS — Z0000 Encounter for general adult medical examination without abnormal findings: Secondary | ICD-10-CM

## 2023-01-16 ENCOUNTER — Ambulatory Visit: Payer: No Typology Code available for payment source

## 2023-01-21 ENCOUNTER — Telehealth (HOSPITAL_BASED_OUTPATIENT_CLINIC_OR_DEPARTMENT_OTHER): Payer: Self-pay

## 2023-01-21 ENCOUNTER — Ambulatory Visit (INDEPENDENT_AMBULATORY_CARE_PROVIDER_SITE_OTHER): Payer: No Typology Code available for payment source | Admitting: Physician Assistant

## 2023-01-21 ENCOUNTER — Encounter: Payer: Self-pay | Admitting: Physician Assistant

## 2023-01-21 DIAGNOSIS — M1712 Unilateral primary osteoarthritis, left knee: Secondary | ICD-10-CM | POA: Diagnosis not present

## 2023-01-21 MED ORDER — METHYLPREDNISOLONE ACETATE 40 MG/ML IJ SUSP
40.0000 mg | INTRAMUSCULAR | Status: AC | PRN
Start: 2023-01-21 — End: 2023-01-21
  Administered 2023-01-21: 40 mg via INTRA_ARTICULAR

## 2023-01-21 MED ORDER — LIDOCAINE HCL 1 % IJ SOLN
5.0000 mL | INTRAMUSCULAR | Status: AC | PRN
  Administered 2023-01-21: 5 mL

## 2023-01-21 NOTE — Progress Notes (Signed)
Office Visit Note   Patient: Kristen Villarreal           Date of Birth: 02-27-1956           MRN: [id number] Visit Date: 01/21/2023              Requested by: Fleet Contras, MD [address] [place],  [place] [address] PCP: Fleet Contras, MD   Assessment & Plan: Visit Diagnoses: No diagnosis found.  Plan:  Will try to gain approval for Monovisc injection in left knee.  She has had previous Monovisc injection December 2023.  Last cortisone injection knee was given 11/05/22 helped some.  She notes some swelling in the knee.  She is having difficulty ambulating due to left knee pain.  She has known osteoarthritis of the left knee.  She has no planned surgery on the left knee next 6 months.  Follow-Up Instructions: No follow-ups on file.   Orders:  Orders Placed This Encounter  Procedures   Large Joint Inj   No orders of the defined types were placed in this encounter.     Procedures: Large Joint Inj: L knee on 01/21/2023 10:52 AM Indications: pain Details: 22 G 1.5 in needle, superolateral approach  Arthrogram: No  Medications: 5 mL lidocaine 1 %; 40 mg methylPREDNISolone acetate 40 MG/ML Aspirate: 18 mL yellow Outcome: tolerated well, no immediate complications Procedure, treatment alternatives, risks and benefits explained, specific risks discussed. Consent was given by the patient. Immediately prior to procedure a time out was called to verify the correct patient, procedure, equipment, support staff and site/side marked as required. Patient was prepped and draped in the usual sterile fashion.       Clinical Data: No additional findings.   Subjective: Chief Complaint  Patient presents with   Right Hand - Routine Post Op   Left Knee - Pain    HPI Kristen Villarreal comes in today she was scheduled for follow-up of her right hand status post carpal tunnel release which was performed on 11/29/2022.  She states overall her hand is doing well.  Her main complaint today  though her left knee.  She is having increased pain in the knee.  She feels that she has fluid on the knee.  She last had a cortisone injection in the knee back in April did well until recently.  She is asking about maybe getting a supplemental injection for the knee last supplemental injection was 07/04/2022 and gave her good relief.  She is diabetic last hemoglobin A1c was 6.8.  She has known arthritis of the medial compartment.  MRI showed no chondral defects of the lateral patellofemoral compartment.  Review of Systems See HPI otherwise negative or noncontributory.  Objective: Vital Signs: There were no vitals taken for this visit.  Physical Exam General well-developed well-nourished female no acute distress.  Ambulates with an antalgic gait on the left no assistive device. Ortho Exam Right hand she has full range of motion subjective full sensation throughout the hand.  Surgical incision is well-healed. Right knee tenderness along medial joint line no instability valgus varus stressing.  Positive effusion no abnormal warmth erythema.  Good range of motion with full extension full flexion.  Specialty Comments:  No specialty comments available.  Imaging: No results found.   PMFS History: Patient Active Problem List   Diagnosis Date Noted   Carpal tunnel syndrome, right upper limb 11/28/2022   Carpal tunnel syndrome, left upper limb 08/10/2021   Past Medical History:  Diagnosis  Date   Anxiety    Arthritis    left shoulder   Asthma, mild    followed by pcp   Carpal tunnel syndrome, right    Complication of anesthesia    woke up during foot surgery   Depression    History of cancer chemotherapy 2002   per pt 3 weeks chemo post hysterecotmy   History of DVT of lower extremity 2010   per pt RLL  completed blood thinnner   History of ovarian cancer 2002   11-16-2022  s/p  TAH with BSO  per pt  stage II ovarian, post surgery had 3 wks of chemo , no radiation and no recurrence    Hyperlipidemia    Hypertension    IDA (iron deficiency anemia)    Lupus erythematosus    11-16-2022  per pt currently not being followed by anyone, dx many yrs ago, involves joints, stated last flare-up few yrs ago   OA (osteoarthritis) of knee    left   Pre-diabetes    Wears dentures    full upper and lower partial   Wears glasses     Family History  Problem Relation Age of Onset   Breast cancer Neg Hx     Past Surgical History:  Procedure Laterality Date   CARPAL TUNNEL RELEASE Left 08/10/2021   Procedure: LEFT CARPAL TUNNEL RELEASE;  Surgeon: Kathryne Hitch, MD;  Location: [facility];  Service: Orthopedics;  Laterality: Left;   CARPAL TUNNEL RELEASE Right 11/29/2022   Procedure: RIGHT CARPAL TUNNEL RELEASE;  Surgeon: Kathryne Hitch, MD;  Location: [facility];  Service: Orthopedics;  Laterality: Right;   FOOT SURGERY Bilateral 1986   bunionectomy both feet and left 5th toe bone removed   TOTAL ABDOMINAL HYSTERECTOMY W/ BILATERAL SALPINGOOPHORECTOMY  2002   Social History   Occupational History   Not on file  Tobacco Use   Smoking status: Never   Smokeless tobacco: Never  Vaping Use   Vaping Use: Never used  Substance and Sexual Activity   Alcohol use: Yes    Comment: seldom   Drug use: Never   Sexual activity: Not on file

## 2023-01-21 NOTE — Telephone Encounter (Signed)
Patient needs auth for left knee gel injection Last inj in Dec

## 2023-01-22 NOTE — Telephone Encounter (Signed)
Submitted for VOB for monovisc-left knee  

## 2023-01-23 LAB — COLOGUARD: COLOGUARD: NEGATIVE

## 2023-01-23 NOTE — Telephone Encounter (Signed)
Received VOB. Prior auth required

## 2023-01-23 NOTE — Telephone Encounter (Signed)
PA completed and faxed

## 2023-01-24 NOTE — Telephone Encounter (Signed)
PA approved until 07/25/23.

## 2023-01-24 NOTE — Telephone Encounter (Signed)
Called and scheduled

## 2023-01-24 NOTE — Telephone Encounter (Signed)
Approved for Monovisc-left knee Buy and bill $20 copay Covered @ 80% PA approved

## 2023-02-01 ENCOUNTER — Other Ambulatory Visit: Payer: Self-pay

## 2023-02-01 DIAGNOSIS — M1712 Unilateral primary osteoarthritis, left knee: Secondary | ICD-10-CM

## 2023-02-05 ENCOUNTER — Ambulatory Visit: Payer: No Typology Code available for payment source | Admitting: Physician Assistant

## 2023-02-05 ENCOUNTER — Encounter: Payer: Self-pay | Admitting: Physician Assistant

## 2023-02-05 DIAGNOSIS — M1712 Unilateral primary osteoarthritis, left knee: Secondary | ICD-10-CM | POA: Diagnosis not present

## 2023-02-05 MED ORDER — HYALURONAN 88 MG/4ML IX SOSY
88.0000 mg | PREFILLED_SYRINGE | INTRA_ARTICULAR | Status: AC | PRN
Start: 2023-02-05 — End: 2023-02-05
  Administered 2023-02-05: 88 mg via INTRA_ARTICULAR

## 2023-02-05 NOTE — Progress Notes (Signed)
   Procedure Note  Patient: Kristen Villarreal             Date of Birth: 05-10-1956           MRN: [id number]             Visit Date: 02/05/2023 HPI: [name] comes in today for scheduled left knee Monovisc injection.  Again she is had cortisone injections in the past along with supplemental injections.  Supplemental injections gave her good relief for some time.  She has known arthritis of the left knee.  She has no planned surgery in the next 6 months on the left knee.  Physical exam: Left knee good range of motion.  No abnormal warmth erythema or effusion. Procedures: Visit Diagnoses:  1. Primary osteoarthritis of left knee     Large Joint Inj: L knee on 02/05/2023 2:18 PM Indications: pain Details: 22 G 1.5 in needle, anterolateral approach  Arthrogram: No  Medications: 88 mg Hyaluronan 88 MG/4ML Outcome: tolerated well, no immediate complications Procedure, treatment alternatives, risks and benefits explained, specific risks discussed. Consent was given by the patient. Immediately prior to procedure a time out was called to verify the correct patient, procedure, equipment, support staff and site/side marked as required. Patient was prepped and draped in the usual sterile fashion.     Plan: She understands to wait least 6 months between supplemental injections.  She will follow-up with [place] as needed.  Questions encouraged and answered at length.

## 2023-02-18 ENCOUNTER — Ambulatory Visit
Admission: RE | Admit: 2023-02-18 | Discharge: 2023-02-18 | Disposition: A | Discharge status: Home / Self Care | Payer: No Typology Code available for payment source | Source: Ambulatory Visit | Attending: Internal Medicine | Admitting: Internal Medicine

## 2023-02-18 DIAGNOSIS — Z0000 Encounter for general adult medical examination without abnormal findings: Secondary | ICD-10-CM

## 2023-08-06 ENCOUNTER — Telehealth: Payer: Self-pay

## 2023-08-06 NOTE — Telephone Encounter (Signed)
 VOB submitted for Monovisc, left knee

## 2023-08-08 ENCOUNTER — Ambulatory Visit: Payer: No Typology Code available for payment source | Admitting: Physician Assistant

## 2023-08-16 ENCOUNTER — Telehealth: Payer: Self-pay

## 2023-08-16 NOTE — Telephone Encounter (Signed)
Talked with Rayna Sexton at [facility] to check status for monovisc, left knee and per Rayna Sexton PA is still pending.

## 2023-08-19 ENCOUNTER — Ambulatory Visit: Payer: No Typology Code available for payment source | Admitting: Physician Assistant

## 2023-08-19 ENCOUNTER — Telehealth: Payer: Self-pay | Admitting: Radiology

## 2023-08-19 ENCOUNTER — Encounter: Payer: Self-pay | Admitting: Physician Assistant

## 2023-08-19 DIAGNOSIS — M65341 Trigger finger, right ring finger: Secondary | ICD-10-CM | POA: Diagnosis not present

## 2023-08-19 DIAGNOSIS — M1712 Unilateral primary osteoarthritis, left knee: Secondary | ICD-10-CM | POA: Diagnosis not present

## 2023-08-19 MED ORDER — HYALURONAN 88 MG/4ML IX SOSY
88.0000 mg | PREFILLED_SYRINGE | INTRA_ARTICULAR | Status: AC | PRN
  Administered 2023-08-19: 88 mg via INTRA_ARTICULAR

## 2023-08-19 MED ORDER — METHYLPREDNISOLONE ACETATE 40 MG/ML IJ SUSP
20.0000 mg | INTRAMUSCULAR | Status: AC | PRN
  Administered 2023-08-19: 20 mg

## 2023-08-19 MED ORDER — LIDOCAINE HCL 1 % IJ SOLN
0.5000 mL | INTRAMUSCULAR | Status: AC | PRN
  Administered 2023-08-19: .5 mL

## 2023-08-19 NOTE — Progress Notes (Signed)
   Procedure Note  Patient: Kristen Villarreal             Date of Birth: September 03, 1955           MRN: [id number]             Visit Date: 08/19/2023 HPI: Mrs. Ernsberger comes in today for scheduled left knee Monovisc injection.  Last Monovisc injection was given 02/05/2023 and gave her good relief until November.  She has tried cortisone injections and has had more relief with the viscosupplementation injections.  She has known arthritis left knee.  No planned surgery in the next 6 months. She also notes that her right ring finger is getting locked down.  She has had no known injury to this.  She is able to straighten the finger on her own but is painful.  She is prediabetic.  Review of systems: Denies any fevers chills.  Physical exam: General Well-developed well-nourished female no acute distress mood affect appropriate. Right hand: Active triggering right ring finger.  Tenderness over the A1 pulley. Left knee: Good range of motion.  No abnormal warmth erythema.  Slight effusion.  No instability.  Procedures: Visit Diagnoses:  1. Primary osteoarthritis of left knee   2. Trigger finger, right ring finger     Large Joint Inj: L knee on 08/19/2023 10:03 AM Indications: pain Details: 22 G 1.5 in needle, superolateral approach  Arthrogram: No  Medications: 88 mg Hyaluronan 88 MG/4ML Aspirate: 7 mL yellow Outcome: tolerated well, no immediate complications Procedure, treatment alternatives, risks and benefits explained, specific risks discussed. Consent was given by the patient. Immediately prior to procedure a time out was called to verify the correct patient, procedure, equipment, support staff and site/side marked as required. Patient was prepped and draped in the usual sterile fashion.    Hand/UE Inj: R ring A1 for trigger finger on 08/19/2023 5:42 PM Indications: pain and therapeutic Details: volar approach Medications: 0.5 mL lidocaine 1 %; 20 mg methylPREDNISolone acetate 40 MG/ML Consent  was given by the patient. Immediately prior to procedure a time out was called to verify the correct patient, procedure, equipment, support staff and site/side marked as required. Patient was prepped and draped in the usual sterile fashion.     Plan: She knows to wait at least 6 months between Monovisc injections.  Discussed other options for her trigger finger including surgical release with trigger finger if triggering continues or becomes worse.  She follow-up with [place] as needed.

## 2023-08-19 NOTE — Telephone Encounter (Signed)
I called [facility], Monovisc is authorized #[id number], valid for DOS 08/16/23- 11/13/23.

## 2023-08-21 NOTE — Telephone Encounter (Signed)
Noted! Thank you

## 2023-08-22 ENCOUNTER — Other Ambulatory Visit: Payer: Self-pay

## 2023-08-22 DIAGNOSIS — M1712 Unilateral primary osteoarthritis, left knee: Secondary | ICD-10-CM

## 2024-01-06 ENCOUNTER — Other Ambulatory Visit: Payer: Self-pay | Admitting: Internal Medicine

## 2024-01-06 DIAGNOSIS — Z1231 Encounter for screening mammogram for malignant neoplasm of breast: Secondary | ICD-10-CM

## 2024-01-22 ENCOUNTER — Other Ambulatory Visit (INDEPENDENT_AMBULATORY_CARE_PROVIDER_SITE_OTHER): Payer: Self-pay

## 2024-01-22 ENCOUNTER — Ambulatory Visit: Admitting: Physician Assistant

## 2024-01-22 DIAGNOSIS — M1712 Unilateral primary osteoarthritis, left knee: Secondary | ICD-10-CM

## 2024-01-22 DIAGNOSIS — M25512 Pain in left shoulder: Secondary | ICD-10-CM

## 2024-01-22 MED ORDER — METHYLPREDNISOLONE ACETATE 40 MG/ML IJ SUSP
40.0000 mg | INTRAMUSCULAR | Status: AC | PRN
  Administered 2024-01-22: 40 mg via INTRA_ARTICULAR

## 2024-01-22 MED ORDER — LIDOCAINE HCL 1 % IJ SOLN
3.0000 mL | INTRAMUSCULAR | Status: AC | PRN
  Administered 2024-01-22: 3 mL

## 2024-01-22 NOTE — Progress Notes (Signed)
 Office Visit Note   Patient: Kristen Villarreal           Date of Birth: March 14, 1956           MRN: [id number] Visit Date: 01/22/2024              Requested by: Shelda Atlas, MD [address] [place],  [place] [address] PCP: Shelda Atlas, MD   Assessment & Plan: Visit Diagnoses:  1. Primary osteoarthritis of left knee   2. Acute pain of left shoulder     Plan: She will work on left shoulder wall crawls and forward flexion exercises as shown.  Follow-up with us  if pain persist or becomes worse.  Questions encouraged and answered at length.  She knows to wait least 3 months between knee injections.  Questions were encouraged and answered  Follow-Up Instructions: Return if symptoms worsen or fail to improve.   Orders:  Orders Placed This Encounter  Procedures   Large Joint Inj   Large Joint Inj   XR Shoulder Left   XR Knee 1-2 Views Left   No orders of the defined types were placed in this encounter.     Procedures: Large Joint Inj: L knee on 01/22/2024 11:40 AM Indications: pain Details: 22 G 1.5 in needle, anterolateral approach  Arthrogram: No  Medications: 3 mL lidocaine  1 %; 40 mg methylPREDNISolone  acetate 40 MG/ML Outcome: tolerated well, no immediate complications Procedure, treatment alternatives, risks and benefits explained, specific risks discussed. Consent was given by the patient. Immediately prior to procedure a time out was called to verify the correct patient, procedure, equipment, support staff and site/side marked as required. Patient was prepped and draped in the usual sterile fashion.    Large Joint Inj: L subacromial bursa on 01/22/2024 11:40 AM Indications: pain Details: 22 G 1.5 in needle, superior approach  Arthrogram: No  Medications: 3 mL lidocaine  1 %; 40 mg methylPREDNISolone  acetate 40 MG/ML Outcome: tolerated well, no immediate complications Procedure, treatment alternatives, risks and benefits explained, specific risks discussed.  Consent was given by the patient. Immediately prior to procedure a time out was called to verify the correct patient, procedure, equipment, support staff and site/side marked as required. Patient was prepped and draped in the usual sterile fashion.       Clinical Data: No additional findings.   Subjective: Chief Complaint  Patient presents with   Left Knee - Pain    02/05/23 LT KNEE MONOVISC   Left Shoulder - Pain    HPI Kristen Villarreal 68 year old female well-known to Dr. Damian service.  Presents today due to left shoulder pain and left knee pain.  Status post fall from bed 2 weeks ago.  She states she rolled out of bed and hit the stepstool.  She fell onto her left knee and shoulder.  Known osteoarthritis left knee.  Gel injection 08/19/2023 left knee.  Now using a cane to ambulate Acute left shoulder pain on chronic shoulder pain.  She notes decreased range of motion since the incident in which she fell onto the footstool of her bed.  She had no numbness tingling down the arm.  She is taken some tramadol  which she had at home for both the shoulder and the knee.  Review of Systems  Constitutional:  Negative for chills and fever.     Objective: Vital Signs: There were no vitals taken for this visit.  Physical Exam Constitutional:      Appearance: She is not ill-appearing or diaphoretic.  Pulmonary:     Effort: Pulmonary effort is normal.   Neurological:     Mental Status: She is alert and oriented to person, place, and time.   Psychiatric:        Mood and Affect: Mood normal.     Ortho Exam Left shoulder prior to injection forward flexion actively and passively only to approximately 80 degrees passively postinjection unable to bring her arm to the 180 degrees overhead.  She has 5 out of 5 strength with external and internal rotation against resistance left shoulder.  Positive impingement testing left shoulder.  Some minor tenderness over the [facility] joint negative abduction  test. Left knee: Good range of motion.  No abnormal warmth erythema or effusion.  No instability.  Patellofemoral crepitus.  Specialty Comments:  No specialty comments available.  Imaging: XR Knee 1-2 Views Left Result Date: 01/22/2024 Left knee 2 views: Knee is well located.   Moderate to moderately severe narrowing medial joint line.  Moderate patellofemoral changes.  Lateral compartment overall well-preserved.  No acute fractures acute findings.  No bony abnormalities otherwise.  XR Shoulder Left Result Date: 01/22/2024 Left shoulder 3 views: Significant AC joint arthritic changes.  No acute fractures acute findings.  Shoulder is well located.  Glenohumeral joint is well-maintained.    PMFS History: Patient Active Problem List   Diagnosis Date Noted   Carpal tunnel syndrome, right upper limb 11/28/2022   Carpal tunnel syndrome, left upper limb 08/10/2021   Past Medical History:  Diagnosis Date   Anxiety    Arthritis    left shoulder   Asthma, mild    followed by pcp   Carpal tunnel syndrome, right    Complication of anesthesia    woke up during foot surgery   Depression    History of cancer chemotherapy 2002   per pt 3 weeks chemo post hysterecotmy   History of DVT of lower extremity 2010   per pt RLL  completed blood thinnner   History of ovarian cancer 2002   11-16-2022  s/p  TAH with BSO  per pt  stage II ovarian, post surgery had 3 wks of chemo , no radiation and no recurrence   Hyperlipidemia    Hypertension    IDA (iron deficiency anemia)    Lupus erythematosus    11-16-2022  per pt currently not being followed by anyone, dx many yrs ago, involves joints, stated last flare-up few yrs ago   OA (osteoarthritis) of knee    left   Pre-diabetes    Wears dentures    full upper and lower partial   Wears glasses     Family History  Problem Relation Age of Onset   Breast cancer Neg Hx     Past Surgical History:  Procedure Laterality Date   CARPAL TUNNEL RELEASE  Left 08/10/2021   Procedure: LEFT CARPAL TUNNEL RELEASE;  Surgeon: Vernetta Lonni GRADE, MD;  Location: [facility];  Service: Orthopedics;  Laterality: Left;   CARPAL TUNNEL RELEASE Right 11/29/2022   Procedure: RIGHT CARPAL TUNNEL RELEASE;  Surgeon: Vernetta Lonni GRADE, MD;  Location: [facility];  Service: Orthopedics;  Laterality: Right;   FOOT SURGERY Bilateral 1986   bunionectomy both feet and left 5th toe bone removed   TOTAL ABDOMINAL HYSTERECTOMY W/ BILATERAL SALPINGOOPHORECTOMY  2002   Social History   Occupational History   Not on file  Tobacco Use   Smoking status: Never   Smokeless tobacco: Never  Vaping Use  Vaping status: Never Used  Substance and Sexual Activity   Alcohol use: Yes    Comment: seldom   Drug use: Never   Sexual activity: Not on file

## 2024-02-20 ENCOUNTER — Ambulatory Visit

## 2024-03-17 ENCOUNTER — Ambulatory Visit
Admission: RE | Admit: 2024-03-17 | Discharge: 2024-03-17 | Disposition: A | Discharge status: Home / Self Care | Source: Ambulatory Visit | Attending: Internal Medicine | Admitting: Internal Medicine

## 2024-03-17 DIAGNOSIS — Z1231 Encounter for screening mammogram for malignant neoplasm of breast: Secondary | ICD-10-CM

## 2024-06-01 ENCOUNTER — Encounter: Payer: Self-pay | Admitting: Radiology

## 2024-06-22 ENCOUNTER — Other Ambulatory Visit: Payer: Self-pay | Admitting: Radiology

## 2024-06-22 ENCOUNTER — Ambulatory Visit (INDEPENDENT_AMBULATORY_CARE_PROVIDER_SITE_OTHER): Admitting: Physician Assistant

## 2024-06-22 DIAGNOSIS — M1712 Unilateral primary osteoarthritis, left knee: Secondary | ICD-10-CM

## 2024-06-22 DIAGNOSIS — M25512 Pain in left shoulder: Secondary | ICD-10-CM

## 2024-06-22 MED ORDER — LIDOCAINE HCL 1 % IJ SOLN
3.0000 mL | INTRAMUSCULAR | Status: AC | PRN
  Administered 2024-06-22: 3 mL

## 2024-06-22 MED ORDER — METHYLPREDNISOLONE ACETATE 40 MG/ML IJ SUSP
40.0000 mg | INTRAMUSCULAR | Status: AC | PRN
  Administered 2024-06-22: 40 mg via INTRA_ARTICULAR

## 2024-06-22 NOTE — Progress Notes (Signed)
 HPI: [name] comes in today for left knee pain.  She is last seen 01/22/2024 and was given a left knee injection.  She is asking for an injection of the knee today as the last 1 was beneficial until almost 6 weeks ago.  She has had no new injury in the knee but she does report due to the knee she almost fell yesterday she was getting follow-up and the knee gave way.  She has difficulty getting up and down due to the knee pain.  She also has trouble with steps. She continues to have left shoulder pain which we saw her also on 01/22/2024 before.  At that time she had fallen out of bed and injured the left shoulder when she struck it against a stool.  She was given subacromial injection at that time and states this was somewhat helpful but she still has decreased range of motion despite trying to do the home exercise program as she was shown for the shoulder.  Shoulder does wake her at night.  She has been taking Aleve without any real relief.  Review of systems: See HPI otherwise negative  Physical exam: General well-developed well-nourished female in acute distress. Left knee: No abnormal warmth erythema or effusion.  Good range of motion.  Patellofemoral crepitus.  No instability valgus varus stressing. Bilateral shoulders: 5 out of 5 strength with external and internal rotation against resistance on the right.  Left shoulder she has slight weakness with external rotation against resistance.  Positive liftoff exam on the left only.  Decreased range of motion overhead with the left shoulder actively 90 degrees but passively greater to 160 degrees full range of motion of the right shoulder.  Positive impingement on the left negative on the right.  Impression: Left knee arthritis Left shoulder pain  Plan: Given the fact that she continues to have decreased range of motion and weakness of the left shoulder status post injury as discussed above.  Recommend MRI of the right shoulder rule out rotator cuff  tear.  In regards to the left knee she did request injection today and therefore this was given.  She also has had viscosupplementation injections in the past and feels as if these were more beneficial and would like a gel injection ordered for her left knee.  Will see her back after the MRI of the left shoulder and once the left knee viscosupplementation injections approved.    Procedure Note  Patient: Kristen Villarreal             Date of Birth: 01/21/1956           MRN: [id number]             Visit Date: 06/22/2024  Procedures: Visit Diagnoses: No diagnosis found.  Large Joint Inj: L knee on 06/22/2024 11:58 AM Indications: pain Details: 22 G 1.5 in needle, anterolateral approach  Arthrogram: No  Medications: 3 mL lidocaine  1 %; 40 mg methylPREDNISolone  acetate 40 MG/ML Outcome: tolerated well, no immediate complications Procedure, treatment alternatives, risks and benefits explained, specific risks discussed. Consent was given by the patient. Immediately prior to procedure a time out was called to verify the correct patient, procedure, equipment, support staff and site/side marked as required. Patient was prepped and draped in the usual sterile fashion.

## 2024-06-29 ENCOUNTER — Encounter: Payer: Self-pay | Admitting: Physician Assistant

## 2024-07-11 ENCOUNTER — Inpatient Hospital Stay
Admission: RE | Admit: 2024-07-11 | Discharge: 2024-07-11 | Attending: Physician Assistant | Admitting: Physician Assistant

## 2024-07-11 DIAGNOSIS — M25512 Pain in left shoulder: Secondary | ICD-10-CM

## 2024-07-22 ENCOUNTER — Ambulatory Visit: Admitting: Physician Assistant

## 2024-07-22 ENCOUNTER — Other Ambulatory Visit: Payer: Self-pay

## 2024-07-22 ENCOUNTER — Other Ambulatory Visit: Payer: Self-pay | Admitting: Radiology

## 2024-07-22 ENCOUNTER — Encounter: Payer: Self-pay | Admitting: Physician Assistant

## 2024-07-22 DIAGNOSIS — M7542 Impingement syndrome of left shoulder: Secondary | ICD-10-CM

## 2024-07-22 DIAGNOSIS — M1712 Unilateral primary osteoarthritis, left knee: Secondary | ICD-10-CM

## 2024-07-22 DIAGNOSIS — M19019 Primary osteoarthritis, unspecified shoulder: Secondary | ICD-10-CM | POA: Diagnosis not present

## 2024-07-22 NOTE — Progress Notes (Signed)
 HPI: Kristen Villarreal returns today to go over the MRI of her left shoulder.  This shows severe tendinosis involving the supraspinatus.  Also severe infraspinatus tendinosis with low-grade interstitial tearing at the muscle tendinous junction.  Severe tendinosis of the intra-articular portion of the long head of the biceps tendon.  Moderate AC joint arthritic changes.  Images are reviewed with the patient.  Review of systems: Negative for fevers chills.  Physical exam: General well-developed well-nourished female no acute distress.  Bilateral shoulders: 5 out of 5 strength with internal and external rotation against resistance.  Liftoff test is negative bilaterally.  Empty can test is negative bilaterally.  Impingement testing is positive on the left negative on the right.  Tenderness over the left AC joint.  No tenderness over the right AC joint.  Decreased range of motion actively left shoulder approximately 90 degrees passively able bring her to 140 degrees.  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 Burnetta.  Then have her back 2 weeks later to see how much her pain is relieved.  She may require shoulder arthroscopy with debridement and possible AC joint resection.  Questions were encouraged and answered at length.

## 2024-07-27 ENCOUNTER — Ambulatory Visit: Admitting: Physician Assistant

## 2024-08-11 ENCOUNTER — Ambulatory Visit: Admitting: Sports Medicine

## 2024-08-11 ENCOUNTER — Encounter: Payer: Self-pay | Admitting: Sports Medicine

## 2024-08-11 ENCOUNTER — Other Ambulatory Visit: Payer: Self-pay

## 2024-08-11 DIAGNOSIS — M25512 Pain in left shoulder: Secondary | ICD-10-CM | POA: Diagnosis not present

## 2024-08-11 DIAGNOSIS — G8929 Other chronic pain: Secondary | ICD-10-CM | POA: Diagnosis not present

## 2024-08-11 DIAGNOSIS — M19019 Primary osteoarthritis, unspecified shoulder: Secondary | ICD-10-CM

## 2024-08-11 DIAGNOSIS — M19012 Primary osteoarthritis, left shoulder: Secondary | ICD-10-CM

## 2024-08-11 MED ORDER — LIDOCAINE HCL 1 % IJ SOLN
0.5000 mL | INTRAMUSCULAR | Status: AC | PRN
  Administered 2024-08-11: .5 mL

## 2024-08-11 MED ORDER — METHYLPREDNISOLONE ACETATE 40 MG/ML IJ SUSP
40.0000 mg | INTRAMUSCULAR | Status: AC | PRN
  Administered 2024-08-11: 40 mg via INTRA_ARTICULAR

## 2024-08-11 NOTE — Progress Notes (Signed)
" ° °  Procedure Note  Patient: Kristen Villarreal             Date of Birth: June 06, 1956           MRN: [id number]             Visit Date: 08/11/2024  Procedures: Visit Diagnoses:  1. AC joint arthropathy   2. Chronic left shoulder pain    Medium Joint Inj: L acromioclavicular on 08/11/2024 9:12 AM Indications: pain Details: 25 G 1.5 in needle, ultrasound-guided anterior approach Medications: 0.5 mL lidocaine  1 %; 40 mg methylPREDNISolone  acetate 40 MG/ML  US -guided AC Joint injection, left shoulder After discussion on risks/benefits/indications, informed verbal consent was obtained. A timeout was then performed. The patient was seated in examination room. The area overlying the [facility] joint of the shoulder was prepped with Betadine and alcohol swab then utilizing ultrasound guidance, patient's AC joint was injected using a 25G, 1.5 needle with 0.5:1.30mL lidocaine :depomedrol of injectate via an out-of-plane, walk-down approach. Visualization of injectate flow was noted under ultrasound guidance. Patient tolerated the procedure well without immediate complications.   Procedure, treatment alternatives, risks and benefits explained, specific risks discussed. Consent was given by the patient. Immediately prior to procedure a time out was called to verify the correct patient, procedure, equipment, support staff and site/side marked as required. Patient was prepped and draped in the usual sterile fashion.     - patient tolerated procedure well, discussed post-injection protocol - follow-up with Tory Gaskins, PA-C as indicated; I am happy to see them as needed  Lonell Sprang, DO Primary Care Sports Medicine Physician  [facility] - Orthoped[facility]  This note was dictated using Dragon naturally speaking software and may contain errors in syntax, spelling, or content which have not been identified prior to signing this note.     "

## 2024-08-26 ENCOUNTER — Ambulatory Visit: Admitting: Orthopaedic Surgery

## 2024-08-26 ENCOUNTER — Encounter: Payer: Self-pay | Admitting: Orthopaedic Surgery

## 2024-08-26 DIAGNOSIS — M19019 Primary osteoarthritis, unspecified shoulder: Secondary | ICD-10-CM | POA: Diagnosis not present

## 2024-08-26 DIAGNOSIS — M25512 Pain in left shoulder: Secondary | ICD-10-CM

## 2024-08-26 DIAGNOSIS — G8929 Other chronic pain: Secondary | ICD-10-CM

## 2024-08-26 NOTE — Progress Notes (Signed)
 [name] comes in for follow-up as a relates to her left shoulder.  She has had a subacromial injection left shoulder as well as now an [facility] joint injection in the left shoulder under ultrasound by Dr. Burnetta.  She is 69 years old.  She reports still significant decrease in motion of her left shoulder and pain with overhead activities.  Her previous MRI of her shoulder shows severe tendinosis of the rotator cuff with AC joint arthropathy and severe tendinosis of the long head of the biceps tendon in the joint.  On exam she still hurts quite a bit with overhead activities including abduction and forward flexion of the left shoulder with no gross weakness but certainly decreased motion and pain.  This point I would like to send her to my partner Dr. Addie for his assessment of her left shoulder and for determining whether or not she is a surgical candidate for this shoulder.  If that is the case, she understands that I would like him to resume the care of her left shoulder from a surgical standpoint.  She agrees with this as well.

## 2024-09-16 ENCOUNTER — Encounter: Admitting: Orthopedic Surgery

## 2024-09-17 ENCOUNTER — Encounter: Admitting: Orthopedic Surgery
